# Patient Record
Sex: Female | Born: 1950 | Race: White | Hispanic: No | Smoking: Never smoker
Health system: Southern US, Community
[De-identification: ages and names within clinical notes are randomized; demographics above are authoritative.]

## PROBLEM LIST (undated history)

## (undated) ENCOUNTER — Emergency Department (HOSPITAL_COMMUNITY): Discharge status: Against Medical Advice | Payer: Self-pay

## (undated) DIAGNOSIS — M1990 Unspecified osteoarthritis, unspecified site: Secondary | ICD-10-CM

## (undated) DIAGNOSIS — R5383 Other fatigue: Secondary | ICD-10-CM

## (undated) DIAGNOSIS — I10 Essential (primary) hypertension: Secondary | ICD-10-CM

## (undated) DIAGNOSIS — M549 Dorsalgia, unspecified: Secondary | ICD-10-CM

## (undated) DIAGNOSIS — K219 Gastro-esophageal reflux disease without esophagitis: Secondary | ICD-10-CM

## (undated) DIAGNOSIS — F329 Major depressive disorder, single episode, unspecified: Secondary | ICD-10-CM

## (undated) DIAGNOSIS — D649 Anemia, unspecified: Secondary | ICD-10-CM

## (undated) DIAGNOSIS — F32A Depression, unspecified: Secondary | ICD-10-CM

## (undated) HISTORY — DX: Unspecified osteoarthritis, unspecified site: M19.90

## (undated) HISTORY — DX: Dorsalgia, unspecified: M54.9

## (undated) HISTORY — PX: ABDOMINAL HYSTERECTOMY: SHX81

## (undated) HISTORY — DX: Major depressive disorder, single episode, unspecified: F32.9

## (undated) HISTORY — PX: KNEE SURGERY: SHX244

## (undated) HISTORY — DX: Essential (primary) hypertension: I10

## (undated) HISTORY — PX: TENDON REPAIR: SHX5111

## (undated) HISTORY — DX: Other fatigue: R53.83

## (undated) HISTORY — DX: Depression, unspecified: F32.A

---

## 2005-01-17 ENCOUNTER — Encounter: Admission: RE | Admit: 2005-01-17 | Discharge: 2005-01-17 | Payer: Self-pay | Admitting: Orthopedic Surgery

## 2005-01-29 ENCOUNTER — Ambulatory Visit (HOSPITAL_COMMUNITY): Admission: RE | Admit: 2005-01-29 | Discharge: 2005-01-29 | Payer: Self-pay | Admitting: Orthopedic Surgery

## 2005-01-29 ENCOUNTER — Ambulatory Visit (HOSPITAL_BASED_OUTPATIENT_CLINIC_OR_DEPARTMENT_OTHER): Admission: RE | Admit: 2005-01-29 | Discharge: 2005-01-29 | Payer: Self-pay | Admitting: Orthopedic Surgery

## 2007-04-09 ENCOUNTER — Ambulatory Visit (HOSPITAL_BASED_OUTPATIENT_CLINIC_OR_DEPARTMENT_OTHER): Admission: RE | Admit: 2007-04-09 | Discharge: 2007-04-09 | Payer: Self-pay | Admitting: Orthopedic Surgery

## 2010-01-16 ENCOUNTER — Ambulatory Visit (HOSPITAL_COMMUNITY): Admission: RE | Admit: 2010-01-16 | Discharge: 2010-01-16 | Payer: Self-pay | Admitting: General Surgery

## 2010-01-17 ENCOUNTER — Ambulatory Visit (HOSPITAL_COMMUNITY): Admission: RE | Admit: 2010-01-17 | Discharge: 2010-01-17 | Payer: Self-pay | Admitting: General Surgery

## 2010-01-23 ENCOUNTER — Encounter
Admission: RE | Admit: 2010-01-23 | Discharge: 2010-04-03 | Payer: Self-pay | Source: Home / Self Care | Attending: General Surgery | Admitting: General Surgery

## 2010-02-10 ENCOUNTER — Encounter: Payer: Self-pay | Admitting: Internal Medicine

## 2010-02-28 ENCOUNTER — Ambulatory Visit: Payer: Self-pay | Admitting: Internal Medicine

## 2010-02-28 ENCOUNTER — Ambulatory Visit (HOSPITAL_COMMUNITY): Admission: RE | Admit: 2010-02-28 | Discharge: 2010-02-28 | Payer: Self-pay | Admitting: Internal Medicine

## 2010-04-10 ENCOUNTER — Inpatient Hospital Stay (HOSPITAL_COMMUNITY)
Admission: RE | Admit: 2010-04-10 | Discharge: 2010-04-12 | Payer: Self-pay | Source: Home / Self Care | Attending: General Surgery | Admitting: General Surgery

## 2010-04-10 HISTORY — PX: ROUX-EN-Y GASTRIC BYPASS: SHX1104

## 2010-04-11 ENCOUNTER — Encounter (INDEPENDENT_AMBULATORY_CARE_PROVIDER_SITE_OTHER): Payer: Self-pay | Admitting: General Surgery

## 2010-04-18 ENCOUNTER — Encounter
Admission: RE | Admit: 2010-04-18 | Discharge: 2010-05-30 | Payer: Self-pay | Source: Home / Self Care | Attending: General Surgery | Admitting: General Surgery

## 2010-05-30 NOTE — Letter (Signed)
Summary: TRIAGE ORDER  TRIAGE ORDER   Imported By: Ave Filter 02/10/2010 10:52:46  _____________________________________________________________________  External Attachment:    Type:   Image     Comment:   External Document

## 2010-05-31 ENCOUNTER — Encounter: Admit: 2010-05-31 | Payer: Self-pay | Admitting: General Surgery

## 2010-05-31 ENCOUNTER — Ambulatory Visit: Payer: BC Managed Care – PPO | Attending: General Surgery | Admitting: *Deleted

## 2010-05-31 DIAGNOSIS — Z9884 Bariatric surgery status: Secondary | ICD-10-CM | POA: Insufficient documentation

## 2010-05-31 DIAGNOSIS — Z713 Dietary counseling and surveillance: Secondary | ICD-10-CM | POA: Insufficient documentation

## 2010-05-31 DIAGNOSIS — Z09 Encounter for follow-up examination after completed treatment for conditions other than malignant neoplasm: Secondary | ICD-10-CM | POA: Insufficient documentation

## 2010-07-03 ENCOUNTER — Ambulatory Visit: Payer: BC Managed Care – PPO | Attending: General Surgery | Admitting: *Deleted

## 2010-07-03 DIAGNOSIS — Z713 Dietary counseling and surveillance: Secondary | ICD-10-CM | POA: Insufficient documentation

## 2010-07-03 DIAGNOSIS — Z9884 Bariatric surgery status: Secondary | ICD-10-CM | POA: Insufficient documentation

## 2010-07-03 DIAGNOSIS — Z09 Encounter for follow-up examination after completed treatment for conditions other than malignant neoplasm: Secondary | ICD-10-CM | POA: Insufficient documentation

## 2010-07-11 LAB — DIFFERENTIAL
Basophils Absolute: 0 10*3/uL (ref 0.0–0.1)
Basophils Absolute: 0 10*3/uL (ref 0.0–0.1)
Basophils Relative: 0 % (ref 0–1)
Basophils Relative: 0 % (ref 0–1)
Basophils Relative: 1 % (ref 0–1)
Eosinophils Absolute: 0.1 10*3/uL (ref 0.0–0.7)
Eosinophils Absolute: 0.1 10*3/uL (ref 0.0–0.7)
Eosinophils Absolute: 0.2 10*3/uL (ref 0.0–0.7)
Eosinophils Relative: 1 % (ref 0–5)
Eosinophils Relative: 2 % (ref 0–5)
Lymphocytes Relative: 29 % (ref 12–46)
Lymphs Abs: 2.1 10*3/uL (ref 0.7–4.0)
Monocytes Absolute: 0.6 10*3/uL (ref 0.1–1.0)
Monocytes Absolute: 0.6 10*3/uL (ref 0.1–1.0)
Monocytes Relative: 8 % (ref 3–12)
Monocytes Relative: 9 % (ref 3–12)
Neutro Abs: 4.2 10*3/uL (ref 1.7–7.7)
Neutro Abs: 4.4 10*3/uL (ref 1.7–7.7)
Neutrophils Relative %: 58 % (ref 43–77)
Neutrophils Relative %: 60 % (ref 43–77)
Neutrophils Relative %: 67 % (ref 43–77)

## 2010-07-11 LAB — CBC
HCT: 38.8 % (ref 36.0–46.0)
Hemoglobin: 12 g/dL (ref 12.0–15.0)
Hemoglobin: 14.2 g/dL (ref 12.0–15.0)
MCH: 27.7 pg (ref 26.0–34.0)
MCH: 28.3 pg (ref 26.0–34.0)
MCH: 28.4 pg (ref 26.0–34.0)
MCHC: 30.9 g/dL (ref 30.0–36.0)
MCHC: 32.2 g/dL (ref 30.0–36.0)
MCV: 88.2 fL (ref 78.0–100.0)
Platelets: 164 10*3/uL (ref 150–400)
RBC: 5.02 MIL/uL (ref 3.87–5.11)
RDW: 14.8 % (ref 11.5–15.5)
WBC: 7.1 10*3/uL (ref 4.0–10.5)

## 2010-07-11 LAB — SURGICAL PCR SCREEN: Staphylococcus aureus: NEGATIVE

## 2010-07-11 LAB — COMPREHENSIVE METABOLIC PANEL
ALT: 56 U/L — ABNORMAL HIGH (ref 0–35)
AST: 66 U/L — ABNORMAL HIGH (ref 0–37)
Alkaline Phosphatase: 107 U/L (ref 39–117)
CO2: 29 mEq/L (ref 19–32)
Chloride: 105 mEq/L (ref 96–112)
Creatinine, Ser: 0.76 mg/dL (ref 0.4–1.2)
GFR calc Af Amer: >60 mL/min (ref >60)
GFR calc non Af Amer: >60 mL/min (ref >60)
Potassium: 3.7 mEq/L (ref 3.5–5.1)
Total Bilirubin: 0.6 mg/dL (ref 0.3–1.2)

## 2010-07-11 LAB — HEMOGLOBIN AND HEMATOCRIT, BLOOD
HCT: 38.7 % (ref 36.0–46.0)
Hemoglobin: 12 g/dL (ref 12.0–15.0)
Hemoglobin: 12.8 g/dL (ref 12.0–15.0)

## 2010-09-12 NOTE — Op Note (Signed)
April Noble, April Noble              ACCOUNT NO.:  [id number]   MEDICAL RECORD NO.:  [id number]          PATIENT TYPE:  AMB   LOCATION:  DSC                          FACILITY:  [facility]   PHYSICIAN:  Mila Homer. Sherlean Foot, M.D. DATE OF BIRTH:  01-25-1951   DATE OF PROCEDURE:  04/09/2007  DATE OF DISCHARGE:                               OPERATIVE REPORT   SURGEON:  Mila Homer. Sherlean Foot, M.D.   ASSISTANT:  None.   ANESTHESIA:  General LMA.   PREOPERATIVE DIAGNOSIS:  Left knee osteoarthritis, medial meniscus tear  and Achilles tendonitis, bursitis.   POSTOPERATIVE DIAGNOSIS:  Left knee osteoarthritis, medial meniscus tear  and Achilles tendonitis, bursitis.   PROCEDURE:  Left knee arthroscopy with a partial medial meniscectomy,  chondroplasty in the medial patellofemoral compartments and a  percutaneous injection into the left heel.   INDICATIONS FOR PROCEDURE:  The patient is a 60 year old with mechanical  symptoms.  MRI evidence of meniscus tearing and radiographic evidence of  arthritis and clinical and radiographic evidence of the Achilles  tendonitis and bursitis.   DESCRIPTION OF PROCEDURE:  The patient was laid supine, administered  anesthesia with general LMA anesthesia.  Left leg was prepped and draped  in usual sterile fashion.  Inferolateral inferomedial portals were  created with #11 blade blunt trocar and cannula.  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  Chondroplasty was performed both  surfaces with the [employer] shaver.  I then went into the medial  compartment.  There was grade 3 and 4 chondromalacia here as well.  This  was debrided, chondroplasty performed with a [employer].  There  was lots of tearing, degenerative tearing to the posterior horn medial  meniscus.  An aggressive posterior horn partial medial meniscectomy was  performed straight basket forceps and a Publishing rights manager.  ACL and PCL  appeared normal.  Lateral compartment showed grade 1 to chondromalacia  only no cartilage tears.  I then irrigated closed with 4-0 nylon  sutures, infiltrated with 10 mL of Marcaine morphine mixture in both  portals, dressed with Xeroform dressing, sponges, sterile Webril and Ace  wrap.  After I took down the drapes, I prepped out the medial heel and  placed 8 mL of a 40 mg Depo-Medrol, 6 mL 0.5% Marcaine mixture and  injected that.  The patient was awakened in recovery room in stable  condition.           ______________________________  Mila Homer Sherlean Foot, M.D.     SDL/MEDQ  D:  04/09/2007  T:  04/09/2007  Job:  [id number]

## 2010-09-27 ENCOUNTER — Encounter: Payer: BC Managed Care – PPO | Attending: General Surgery | Admitting: *Deleted

## 2010-09-27 DIAGNOSIS — Z713 Dietary counseling and surveillance: Secondary | ICD-10-CM | POA: Insufficient documentation

## 2010-09-27 DIAGNOSIS — Z09 Encounter for follow-up examination after completed treatment for conditions other than malignant neoplasm: Secondary | ICD-10-CM | POA: Insufficient documentation

## 2010-09-27 DIAGNOSIS — Z9884 Bariatric surgery status: Secondary | ICD-10-CM | POA: Insufficient documentation

## 2010-12-07 ENCOUNTER — Encounter (INDEPENDENT_AMBULATORY_CARE_PROVIDER_SITE_OTHER): Payer: Self-pay | Admitting: General Surgery

## 2010-12-08 ENCOUNTER — Ambulatory Visit (INDEPENDENT_AMBULATORY_CARE_PROVIDER_SITE_OTHER): Payer: BC Managed Care – PPO | Admitting: General Surgery

## 2010-12-08 ENCOUNTER — Other Ambulatory Visit (INDEPENDENT_AMBULATORY_CARE_PROVIDER_SITE_OTHER): Payer: Self-pay | Admitting: General Surgery

## 2010-12-08 DIAGNOSIS — Z9884 Bariatric surgery status: Secondary | ICD-10-CM

## 2010-12-08 NOTE — Progress Notes (Signed)
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 She is getting along very well. She has some occasional mild heartburn but does not require medications. She gets some bloating and pressure occasionally after meals but no severe pain. She is overall very pleased with her success.  Past medical history is significant for hypertension now off all medications, joint pain greatly improved, sleep apnea now off of CPAP.  Exam: General: Appears well Skin: No rash or infection Lungs: Clear without wheezing or increased work of breathing Cardiovascular: 2/6 systolic murmur. Regular rate and rhythm. Abdomen: Soft and nontender. Incisions well healed without hernias. No mass or organomegaly.  Laboratory: Will obtain routine lab work today  Assessment and plan: Doing very well following Roux-en-Y gastric bypass with excellent weight loss. Total weight loss is 68 pounds and her BMI is down to 28.8. She has excellent resolution of comorbidities. Return at her one-year anniversary.

## 2010-12-09 LAB — CBC WITH DIFFERENTIAL/PLATELET
Basophils Absolute: 0 10*3/uL (ref 0.0–0.1)
Eosinophils Relative: 3 % (ref 0–5)
Lymphocytes Relative: 34 % (ref 12–46)
Lymphs Abs: 2.2 10*3/uL (ref 0.7–4.0)
Neutro Abs: 3.4 10*3/uL (ref 1.7–7.7)
Neutrophils Relative %: 54 % (ref 43–77)
Platelets: 197 10*3/uL (ref 150–400)
RBC: 4.99 MIL/uL (ref 3.87–5.11)
RDW: 14.4 % (ref 11.5–15.5)
WBC: 6.4 10*3/uL (ref 4.0–10.5)

## 2010-12-09 LAB — LIPID PANEL
HDL: 58 mg/dL (ref >39)
LDL Cholesterol: 102 mg/dL — ABNORMAL HIGH (ref 0–99)
Total CHOL/HDL Ratio: 3.1 Ratio
VLDL: 19 mg/dL (ref 0–40)

## 2010-12-09 LAB — IRON AND TIBC
TIBC: 390 ug/dL (ref 250–470)
UIBC: 330 ug/dL

## 2010-12-09 LAB — COMPREHENSIVE METABOLIC PANEL
ALT: 14 U/L (ref 0–35)
AST: 18 U/L (ref 0–37)
Alkaline Phosphatase: 105 U/L (ref 39–117)
BUN: 12 mg/dL (ref 6–23)
Calcium: 9.2 mg/dL (ref 8.4–10.5)
Chloride: 105 mEq/L (ref 96–112)
Creat: 0.69 mg/dL (ref 0.50–1.10)

## 2010-12-09 LAB — FOLATE: Folate: 11.2 ng/mL

## 2010-12-12 ENCOUNTER — Telehealth (INDEPENDENT_AMBULATORY_CARE_PROVIDER_SITE_OTHER): Payer: Self-pay

## 2010-12-12 NOTE — Telephone Encounter (Signed)
Message copied by Maryan Puls on Tue Dec 12, 2010  9:32 AM ------      Message from: Glenna Fellows T      Created: Sun Dec 10, 2010  9:59 AM       Please call pt-lab OK but iron saturation a little low. Needs to take one additional iron supplement daily

## 2010-12-12 NOTE — Telephone Encounter (Signed)
Left message for pt to call our office re: iron level.

## 2010-12-14 LAB — VITAMIN B1: Vitamin B1 (Thiamine): 15 nmol/L (ref 9–44)

## 2010-12-28 ENCOUNTER — Telehealth (INDEPENDENT_AMBULATORY_CARE_PROVIDER_SITE_OTHER): Payer: Self-pay | Admitting: General Surgery

## 2011-02-05 LAB — BASIC METABOLIC PANEL
CO2: 30
Calcium: 9.2
GFR calc Af Amer: >60
GFR calc non Af Amer: >60
Potassium: 3.7
Sodium: 138

## 2011-05-11 ENCOUNTER — Ambulatory Visit (INDEPENDENT_AMBULATORY_CARE_PROVIDER_SITE_OTHER): Payer: BC Managed Care – PPO | Admitting: General Surgery

## 2011-05-11 ENCOUNTER — Encounter (INDEPENDENT_AMBULATORY_CARE_PROVIDER_SITE_OTHER): Payer: Self-pay | Admitting: General Surgery

## 2011-05-11 VITALS — BP 118/78 | HR 56 | Temp 98.0°F | Resp 12 | Ht 68.0 in | Wt 173.6 lb

## 2011-05-11 DIAGNOSIS — R198 Other specified symptoms and signs involving the digestive system and abdomen: Secondary | ICD-10-CM

## 2011-05-11 DIAGNOSIS — R109 Unspecified abdominal pain: Secondary | ICD-10-CM

## 2011-05-11 DIAGNOSIS — K912 Postsurgical malabsorption, not elsewhere classified: Secondary | ICD-10-CM

## 2011-05-11 NOTE — Progress Notes (Signed)
Chief complaint: Followup Roux-en-Y gastric bypass   History: April Noble returns just over one year following laparoscopic Roux-en-Y gastric bypass.  Overall she's been getting along very well. She has had excellent weight loss and now has a total weight loss of 78 pounds from preop of 252 and BMI of 39.62 current 174 and BMI of 26.2. Her energy is excellent. She has resolved hypertension on no meds and resolved sleep apnea. Her joint pain is much improved.  She does however have postprandial abdominal discomfort and what she describes as reflux. She describes pressure discomfort up under her chest and in her upper abdomen after eating with also some sensation of burning. No nausea or vomiting or regurgitation. She tried [place] but this didn't really help. She did not have significant reflux prior to surgery.  Exam: BP 118/78  Pulse 56  Temp(Src) 98 F (36.7 C) (Temporal)  Resp 12  Ht 5\' 8"  (1.727 m)  Wt 173 lb 9.6 oz (78.744 kg)  BMI 26.40 kg/m2  Gen.: Healthy-appearing female in no distress Skin: Warm dry no rash or infection Lungs: Clear without wheeze or increased work of breathing Abdomen: Well-healed incision. No hernias. Soft and nontender. Extremities no edema.  Assessment and plan: Overall doing very well following laparoscopic gastric bypass with excellent weight loss and resolution of comorbidities. She does however have some upper abdominal and lower chest discomfort after eating. I think this needs to be evaluated. We are going to get a gallbladder ultrasound and a barium upper GI series to rule out gallstones or stenosis. I will call her back with these results.

## 2011-05-17 ENCOUNTER — Telehealth (INDEPENDENT_AMBULATORY_CARE_PROVIDER_SITE_OTHER): Payer: Self-pay | Admitting: General Surgery

## 2011-05-17 ENCOUNTER — Telehealth (INDEPENDENT_AMBULATORY_CARE_PROVIDER_SITE_OTHER): Payer: Self-pay

## 2011-05-17 ENCOUNTER — Other Ambulatory Visit (INDEPENDENT_AMBULATORY_CARE_PROVIDER_SITE_OTHER): Payer: Self-pay | Admitting: General Surgery

## 2011-05-17 ENCOUNTER — Ambulatory Visit
Admission: RE | Admit: 2011-05-17 | Discharge: 2011-05-17 | Disposition: A | Discharge status: Home / Self Care | Payer: BC Managed Care – PPO | Source: Ambulatory Visit | Attending: General Surgery | Admitting: General Surgery

## 2011-05-17 DIAGNOSIS — R198 Other specified symptoms and signs involving the digestive system and abdomen: Secondary | ICD-10-CM

## 2011-05-17 NOTE — Telephone Encounter (Signed)
Called patient with appointment date & time for 06/07/11 w/Dr. Johna Sheriff to discuss U/S findings (gallstones) and to schedule surgery.

## 2011-05-17 NOTE — Telephone Encounter (Signed)
Called pt with results.

## 2011-05-18 LAB — IRON AND TIBC
%SAT: 26 % (ref 20–55)
TIBC: 399 ug/dL (ref 250–470)

## 2011-05-18 LAB — CBC WITH DIFFERENTIAL/PLATELET
Eosinophils Relative: 3 % (ref 0–5)
HCT: 42.1 % (ref 36.0–46.0)
Hemoglobin: 13.5 g/dL (ref 12.0–15.0)
Lymphocytes Relative: 37 % (ref 12–46)
MCHC: 32.1 g/dL (ref 30.0–36.0)
MCV: 87.5 fL (ref 78.0–100.0)
Monocytes Absolute: 0.3 10*3/uL (ref 0.1–1.0)
Monocytes Relative: 6 % (ref 3–12)
Neutro Abs: 2.7 10*3/uL (ref 1.7–7.7)
RDW: 14.6 % (ref 11.5–15.5)
WBC: 5 10*3/uL (ref 4.0–10.5)

## 2011-05-18 LAB — MAGNESIUM: Magnesium: 1.9 mg/dL (ref 1.5–2.5)

## 2011-05-18 LAB — COMPREHENSIVE METABOLIC PANEL
Albumin: 4.3 g/dL (ref 3.5–5.2)
BUN: 13 mg/dL (ref 6–23)
CO2: 30 mEq/L (ref 19–32)
Calcium: 9.1 mg/dL (ref 8.4–10.5)
Chloride: 104 mEq/L (ref 96–112)
Creat: 0.6 mg/dL (ref 0.50–1.10)
Glucose, Bld: 86 mg/dL (ref 70–99)
Potassium: 3.7 mEq/L (ref 3.5–5.3)

## 2011-05-18 LAB — LIPID PANEL
Cholesterol: 190 mg/dL (ref 0–200)
HDL: 63 mg/dL (ref >39)
Total CHOL/HDL Ratio: 3 Ratio
Triglycerides: 98 mg/dL (ref <150)

## 2011-05-18 LAB — VITAMIN B12: Vitamin B-12: 1077 pg/mL — ABNORMAL HIGH (ref 211–911)

## 2011-05-18 LAB — FOLATE: Folate: >20 ng/mL

## 2011-05-22 ENCOUNTER — Other Ambulatory Visit (INDEPENDENT_AMBULATORY_CARE_PROVIDER_SITE_OTHER): Payer: Self-pay | Admitting: General Surgery

## 2011-05-23 ENCOUNTER — Encounter (HOSPITAL_COMMUNITY): Payer: Self-pay

## 2011-05-24 ENCOUNTER — Other Ambulatory Visit: Payer: Self-pay

## 2011-05-24 ENCOUNTER — Encounter (HOSPITAL_COMMUNITY)
Admission: RE | Admit: 2011-05-24 | Discharge: 2011-05-24 | Disposition: A | Discharge status: Home / Self Care | Payer: BC Managed Care – PPO | Source: Ambulatory Visit | Attending: General Surgery | Admitting: General Surgery

## 2011-05-24 ENCOUNTER — Encounter (HOSPITAL_COMMUNITY): Payer: Self-pay

## 2011-05-24 HISTORY — DX: Gastro-esophageal reflux disease without esophagitis: K21.9

## 2011-05-24 HISTORY — DX: Anemia, unspecified: D64.9

## 2011-05-24 LAB — SURGICAL PCR SCREEN: MRSA, PCR: NEGATIVE

## 2011-05-24 NOTE — Patient Instructions (Addendum)
20 April Noble  05/24/2011   Your procedure is scheduled on:  05-30-2011  Report to [facility] at 0615 AM.  Call this number if you have problems the morning of surgery: [phone]   Remember:   Do not eat food or drink liquids:After Midnight.    Take these medicines the morning of surgery with A SIP OF WATER: no meds to take   Do not wear jewelry, make-up.  Do not wear lotions, powders, or perfumes. Do not wear deodorant.  Do not shave 48 hours prior to surgery.  Do not bring valuables to the hospital.  Contacts, dentures or bridgework may not be worn into surgery.  Leave suitcase in the car. After surgery it may be brought to your room.  For patients admitted to the hospital, checkout time is 11:00 AM the day of discharge.   Patients discharged the day of surgery will not be allowed to drive home.  Name and phone number of your driver: sam spouse [phone]  Special Instructions: CHG Shower Use Special Wash: 1/2 bottle night before surgery and 1/2 bottle morning of surgery. neck down avoid private area   Please read over the following fact sheets that you were given: MRSA Information Cain Sieve rn [facility] pre op nurse phone number [phone] call if needed.

## 2011-05-24 NOTE — Pre-Procedure Instructions (Addendum)
Spoke with dr fortune, pt history of hypertension, no blood pressure meds for 1 year. Order received for ekg, chest xray not needed. Cbc with dif, cmet 1-17-201 in epic

## 2011-05-30 ENCOUNTER — Other Ambulatory Visit (INDEPENDENT_AMBULATORY_CARE_PROVIDER_SITE_OTHER): Payer: Self-pay | Admitting: General Surgery

## 2011-05-30 ENCOUNTER — Encounter (HOSPITAL_COMMUNITY): Payer: Self-pay | Admitting: Anesthesiology

## 2011-05-30 ENCOUNTER — Ambulatory Visit (HOSPITAL_COMMUNITY): Payer: BC Managed Care – PPO

## 2011-05-30 ENCOUNTER — Encounter (HOSPITAL_COMMUNITY)
Admission: RE | Disposition: A | Discharge status: Home / Self Care | Payer: Self-pay | Source: Ambulatory Visit | Attending: General Surgery

## 2011-05-30 ENCOUNTER — Ambulatory Visit (HOSPITAL_COMMUNITY)
Admission: RE | Admit: 2011-05-30 | Discharge: 2011-05-30 | Disposition: A | Discharge status: Home / Self Care | Payer: BC Managed Care – PPO | Source: Ambulatory Visit | Attending: General Surgery | Admitting: General Surgery

## 2011-05-30 ENCOUNTER — Encounter (HOSPITAL_COMMUNITY): Payer: Self-pay | Admitting: *Deleted

## 2011-05-30 ENCOUNTER — Ambulatory Visit (HOSPITAL_COMMUNITY): Payer: BC Managed Care – PPO | Admitting: Anesthesiology

## 2011-05-30 ENCOUNTER — Telehealth (INDEPENDENT_AMBULATORY_CARE_PROVIDER_SITE_OTHER): Payer: Self-pay

## 2011-05-30 DIAGNOSIS — K8010 Calculus of gallbladder with chronic cholecystitis without obstruction: Secondary | ICD-10-CM | POA: Insufficient documentation

## 2011-05-30 DIAGNOSIS — K824 Cholesterolosis of gallbladder: Secondary | ICD-10-CM

## 2011-05-30 DIAGNOSIS — Z01810 Encounter for preprocedural cardiovascular examination: Secondary | ICD-10-CM | POA: Insufficient documentation

## 2011-05-30 DIAGNOSIS — Z01812 Encounter for preprocedural laboratory examination: Secondary | ICD-10-CM | POA: Insufficient documentation

## 2011-05-30 DIAGNOSIS — Z9884 Bariatric surgery status: Secondary | ICD-10-CM | POA: Insufficient documentation

## 2011-05-30 HISTORY — PX: CHOLECYSTECTOMY: SHX55

## 2011-05-30 SURGERY — LAPAROSCOPIC CHOLECYSTECTOMY WITH INTRAOPERATIVE CHOLANGIOGRAM
Anesthesia: General | Site: Abdomen | Wound class: Clean Contaminated

## 2011-05-30 MED ORDER — HYDROMORPHONE HCL PF 1 MG/ML IJ SOLN
INTRAMUSCULAR | Status: AC
  Filled 2011-05-30: qty 1

## 2011-05-30 MED ORDER — PROMETHAZINE HCL 25 MG/ML IJ SOLN: 6.2500 mg | INTRAMUSCULAR | Status: DC | PRN

## 2011-05-30 MED ORDER — NEOSTIGMINE METHYLSULFATE 1 MG/ML IJ SOLN
INTRAMUSCULAR | Status: DC | PRN
  Administered 2011-05-30: 4 mg via INTRAVENOUS

## 2011-05-30 MED ORDER — GLYCOPYRROLATE 0.2 MG/ML IJ SOLN
INTRAMUSCULAR | Status: DC | PRN
  Administered 2011-05-30: 1 mg via INTRAVENOUS

## 2011-05-30 MED ORDER — PROPOFOL 10 MG/ML IV BOLUS
INTRAVENOUS | Status: DC | PRN
  Administered 2011-05-30: 150 mg via INTRAVENOUS

## 2011-05-30 MED ORDER — CEFAZOLIN SODIUM 1-5 GM-% IV SOLN
INTRAVENOUS | Status: AC
  Filled 2011-05-30: qty 50

## 2011-05-30 MED ORDER — BUPIVACAINE-EPINEPHRINE 0.25% -1:200000 IJ SOLN
INTRAMUSCULAR | Status: DC | PRN
  Administered 2011-05-30: 30 mL

## 2011-05-30 MED ORDER — LACTATED RINGERS IV SOLN
INTRAVENOUS | Status: DC | PRN
  Administered 2011-05-30: 08:00:00 via INTRAVENOUS

## 2011-05-30 MED ORDER — ACETAMINOPHEN 10 MG/ML IV SOLN
INTRAVENOUS | Status: DC | PRN
  Administered 2011-05-30: 1000 mg via INTRAVENOUS

## 2011-05-30 MED ORDER — HYDROCODONE-ACETAMINOPHEN 5-325 MG PO TABS: 1.0000 | ORAL_TABLET | ORAL | Status: AC | PRN

## 2011-05-30 MED ORDER — BUPIVACAINE-EPINEPHRINE PF 0.25-1:200000 % IJ SOLN
INTRAMUSCULAR | Status: AC
  Filled 2011-05-30: qty 30

## 2011-05-30 MED ORDER — ROCURONIUM BROMIDE 100 MG/10ML IV SOLN
INTRAVENOUS | Status: DC | PRN
  Administered 2011-05-30: 40 mg via INTRAVENOUS
  Administered 2011-05-30: 10 mg via INTRAVENOUS

## 2011-05-30 MED ORDER — ONDANSETRON HCL 4 MG/2ML IJ SOLN
INTRAMUSCULAR | Status: DC | PRN
  Administered 2011-05-30: 4 mg via INTRAVENOUS

## 2011-05-30 MED ORDER — CEFAZOLIN SODIUM 1-5 GM-% IV SOLN
1.0000 g | INTRAVENOUS | Status: AC
  Administered 2011-05-30: 1 g via INTRAVENOUS

## 2011-05-30 MED ORDER — LACTATED RINGERS IR SOLN
Status: DC | PRN
  Administered 2011-05-30: 1000 mL

## 2011-05-30 MED ORDER — MIDAZOLAM HCL 5 MG/5ML IJ SOLN
INTRAMUSCULAR | Status: DC | PRN
  Administered 2011-05-30: 2 mg via INTRAVENOUS

## 2011-05-30 MED ORDER — LIDOCAINE HCL (CARDIAC) 20 MG/ML IV SOLN
INTRAVENOUS | Status: DC | PRN
  Administered 2011-05-30: 50 mg via INTRAVENOUS

## 2011-05-30 MED ORDER — LACTATED RINGERS IV SOLN: INTRAVENOUS | Status: DC

## 2011-05-30 MED ORDER — HYDROMORPHONE HCL PF 1 MG/ML IJ SOLN
0.2500 mg | INTRAMUSCULAR | Status: DC | PRN
  Administered 2011-05-30: 0.25 mg via INTRAVENOUS

## 2011-05-30 MED ORDER — ACETAMINOPHEN 10 MG/ML IV SOLN
INTRAVENOUS | Status: AC
  Filled 2011-05-30: qty 100

## 2011-05-30 MED ORDER — IOHEXOL 300 MG/ML  SOLN
INTRAMUSCULAR | Status: DC | PRN
  Administered 2011-05-30: 3 mL

## 2011-05-30 MED ORDER — IOHEXOL 300 MG/ML  SOLN
INTRAMUSCULAR | Status: AC
  Filled 2011-05-30: qty 1

## 2011-05-30 MED ORDER — 0.9 % SODIUM CHLORIDE (POUR BTL) OPTIME
TOPICAL | Status: DC | PRN
  Administered 2011-05-30: 1000 mL

## 2011-05-30 MED ORDER — FENTANYL CITRATE 0.05 MG/ML IJ SOLN
INTRAMUSCULAR | Status: DC | PRN
  Administered 2011-05-30 (×2): 50 ug via INTRAVENOUS
  Administered 2011-05-30: 100 ug via INTRAVENOUS

## 2011-05-30 SURGICAL SUPPLY — 44 items
ADH SKN CLS APL DERMABOND .7 (GAUZE/BANDAGES/DRESSINGS) ×1
APL SKNCLS STERI-STRIP NONHPOA (GAUZE/BANDAGES/DRESSINGS)
APPLIER CLIP ROT 10 11.4 M/L (STAPLE) ×2
APR CLP MED LRG 11.4X10 (STAPLE) ×1
BAG SPEC RTRVL LRG 6X4 10 (ENDOMECHANICALS) ×1
BENZOIN TINCTURE PRP APPL 2/3 (GAUZE/BANDAGES/DRESSINGS) IMPLANT
CANISTER SUCTION 2500CC (MISCELLANEOUS) ×2 IMPLANT
CATH REDDICK CHOLANGI 4FR 50CM (CATHETERS) IMPLANT
CLIP APPLIE ROT 10 11.4 M/L (STAPLE) ×1 IMPLANT
CLOTH BEACON ORANGE TIMEOUT ST (SAFETY) ×2 IMPLANT
COVER MAYO STAND STRL (DRAPES) ×2 IMPLANT
COVER SURGICAL LIGHT HANDLE (MISCELLANEOUS) ×1 IMPLANT
DECANTER SPIKE VIAL GLASS SM (MISCELLANEOUS) ×2 IMPLANT
DERMABOND ADVANCED (GAUZE/BANDAGES/DRESSINGS) ×1
DERMABOND ADVANCED .7 DNX12 (GAUZE/BANDAGES/DRESSINGS) IMPLANT
DRAPE C-ARM 42X72 X-RAY (DRAPES) ×2 IMPLANT
DRAPE LAPAROSCOPIC ABDOMINAL (DRAPES) ×2 IMPLANT
ELECT REM PT RETURN 9FT ADLT (ELECTROSURGICAL) ×2
ELECTRODE REM PT RTRN 9FT ADLT (ELECTROSURGICAL) ×1 IMPLANT
GLOVE BIOGEL PI IND STRL 7.0 (GLOVE) ×1 IMPLANT
GLOVE BIOGEL PI INDICATOR 7.0 (GLOVE) ×1
GLOVE SS BIOGEL STRL SZ 7.5 (GLOVE) ×1 IMPLANT
GLOVE SUPERSENSE BIOGEL SZ 7.5 (GLOVE) ×1
GOWN STRL NON-REIN LRG LVL3 (GOWN DISPOSABLE) ×2 IMPLANT
GOWN STRL REIN XL XLG (GOWN DISPOSABLE) ×4 IMPLANT
HEMOSTAT SURGICEL 4X8 (HEMOSTASIS) IMPLANT
IV CATH 14GX2 1/4 (CATHETERS) IMPLANT
IV SET EXT 30 76VOL 4 MALE LL (IV SETS) IMPLANT
KIT BASIN OR (CUSTOM PROCEDURE TRAY) ×2 IMPLANT
NS IRRIG 1000ML POUR BTL (IV SOLUTION) IMPLANT
POUCH SPECIMEN RETRIEVAL 10MM (ENDOMECHANICALS) ×1 IMPLANT
SET CHOLANGIOGRAPH MIX (MISCELLANEOUS) ×2 IMPLANT
SET IRRIG TUBING LAPAROSCOPIC (IRRIGATION / IRRIGATOR) ×2 IMPLANT
SLEEVE Z-THREAD 5X100MM (TROCAR) ×2 IMPLANT
SOLUTION ANTI FOG 6CC (MISCELLANEOUS) ×2 IMPLANT
STOPCOCK K 69 2C6206 (IV SETS) IMPLANT
STRIP CLOSURE SKIN 1/2X4 (GAUZE/BANDAGES/DRESSINGS) IMPLANT
SUT MNCRL AB 4-0 PS2 18 (SUTURE) ×2 IMPLANT
TOWEL OR 17X26 10 PK STRL BLUE (TOWEL DISPOSABLE) ×2 IMPLANT
TRAY LAP CHOLE (CUSTOM PROCEDURE TRAY) ×2 IMPLANT
TROCAR HASSON GELL 12X100 (TROCAR) ×2 IMPLANT
TROCAR Z-THREAD FIOS 11X100 BL (TROCAR) ×2 IMPLANT
TROCAR Z-THREAD FIOS 5X100MM (TROCAR) ×2 IMPLANT
TUBING INSUFFLATION 10FT LAP (TUBING) ×2 IMPLANT

## 2011-05-30 NOTE — Interval H&P Note (Signed)
History and Physical Interval Note:  05/30/2011 7:34 AM  April Noble  has presented today for surgery, with the diagnosis of gallstones  The various methods of treatment have been discussed with the patient and family. After consideration of risks, benefits and other options for treatment, the patient has consented to  Procedure(s): LAPAROSCOPIC CHOLECYSTECTOMY WITH INTRAOPERATIVE CHOLANGIOGRAM as a surgical intervention .  The patients' history has been reviewed, patient examined, no change in status, stable for surgery.  I have reviewed the patients' chart and labs.  Questions were answered to the patient's satisfaction.     [name] T

## 2011-05-30 NOTE — Telephone Encounter (Signed)
Voice message left for f/u appt scheduled for 06/22/11 @ 4:15 w/Dr. Johna Sheriff

## 2011-05-30 NOTE — Transfer of Care (Signed)
Immediate Anesthesia Transfer of Care Note  Patient: April Noble  Procedure(s) Performed:  LAPAROSCOPIC CHOLECYSTECTOMY WITH INTRAOPERATIVE CHOLANGIOGRAM  Patient Location: PACU  Anesthesia Type: General  Level of Consciousness: sedated, patient cooperative and responds to stimulaton  Airway & Oxygen Therapy: Patient Spontanous Breathing and Patient connected to face mask oxgen  Post-op Assessment: Report given to PACU RN and Post -op Vital signs reviewed and stable  Post vital signs: Reviewed and stable  Complications: No apparent anesthesia complications

## 2011-05-30 NOTE — Anesthesia Postprocedure Evaluation (Signed)
  Anesthesia Post-op Note  Patient: April Noble  Procedure(s) Performed:  LAPAROSCOPIC CHOLECYSTECTOMY WITH INTRAOPERATIVE CHOLANGIOGRAM  Patient Location: PACU  Anesthesia Type: General  Level of Consciousness: awake and alert   Airway and Oxygen Therapy: Patient Spontanous Breathing  Post-op Pain: mild  Post-op Assessment: Post-op Vital signs reviewed, Patient's Cardiovascular Status Stable, Respiratory Function Stable, Patent Airway and No signs of Nausea or vomiting  Post-op Vital Signs: stable  Complications: No apparent anesthesia complications

## 2011-05-30 NOTE — Op Note (Signed)
Preoperative diagnosis: Cholelithiasis and cholecystitis  Postoperative diagnosis: Cholelithiasis and cholecystitis  Surgical procedure: Laparoscopic cholecystectomy with intraoperative cholangiogram  Surgeon: Sharlet Salina T. Nicodemus Denk M.D.  Assistant: None  Anesthesia: General Endotracheal  Complications: None  Estimated blood loss: Minimal  Description of procedure: The patient brought to the operating room, placed in the supine position on the operating table, and general endotracheal anesthesia induced. The abdomen was widely sterilely prepped and draped. The patient had received preoperative IV antibiotics and PAS were in place. Patient timeout was performed the correct procedure verified. Standard 4 port technique was used with an open Hassan cannula at the umbilicus and the remainder of the ports placed under direct vision. The gallbladder was visualized. It had cholesterolosis in the wall but was not acutely inflamed.  As the patient is post gastric bypass I initially carefully examined the GI tract. The gastric pouch and Roux limb appeared normal as did the gastric remnant. I traced the Roux limb distally and there was no evidence of Peterson hernia defect. I then traced the small bowel further distally to the jejunojejunostomy which appeared normal and there was no mesenteric defect here either. Attention was then turned to the gallbladder.The fundus was grasped and elevated up over the liver and the infundibulum retracted inferiolaterally. Peritoneum anterior and posterior to close triangle was incised and fibrofatty tissue stripped off the neck of the gallbladder toward the porta hepatis. The distal gallbladder was thoroughly dissected. The cystic artery was identified in close triangle and the cystic duct gallbladder junction dissected 360.  A good critical view was obtained. When the anatomy was clear the cystic duct was clipped at the gallbladder junction and an operative cholangiogram  obtained through the cystic duct. This showed good filling of a normal common bile duct and intrahepatic ducts with free flow into the duodenum and no filling defects. Following this the Cholangiocath was removed and the cystic duct was doubly clipped proximally and divided. The cystic artery was doubly clipped proximally and distally and divided. The gallbladder was dissected free from its bed using hook cautery and removed through the umbilical port site. Complete hemostasis was obtained in the gallbladder bed. The right upper quadrant was thoroughly irrigated and hemostasis assured. Trochars were removed and all CO2 evacuated and the [facility] trocar site fascial defect closed. Skin incisions were closed with subcuticular Monocryl and Dermabond. Sponge needle and instrument counts were correct. The patient was taken to PACU in good condition.  Herschel Fleagle T  05/30/2011

## 2011-05-30 NOTE — Anesthesia Preprocedure Evaluation (Signed)
Anesthesia Evaluation  Patient identified by MRN, date of birth, ID band Patient awake    Reviewed: Allergy & Precautions, H&P , NPO status , Patient's Chart, lab work & pertinent test results  Airway Mallampati: II TM Distance: >3 FB Neck ROM: Full    Dental No notable dental hx.    Pulmonary neg pulmonary ROS,  clear to auscultation  Pulmonary exam normal       Cardiovascular hypertension, Regular Normal    Neuro/Psych PSYCHIATRIC DISORDERS Depression Negative Neurological ROS     GI/Hepatic Neg liver ROS, GERD-  ,  Endo/Other  Negative Endocrine ROS  Renal/GU negative Renal ROS  Genitourinary negative   Musculoskeletal negative musculoskeletal ROS (+)   Abdominal   Peds negative pediatric ROS (+)  Hematology negative hematology ROS (+)   Anesthesia Other Findings   Reproductive/Obstetrics negative OB ROS                           Anesthesia Physical Anesthesia Plan  ASA: II  Anesthesia Plan: General   Post-op Pain Management:    Induction: Intravenous  Airway Management Planned: Oral ETT  Additional Equipment:   Intra-op Plan:   Post-operative Plan: Extubation in OR  Informed Consent: I have reviewed the patients History and Physical, chart, labs and discussed the procedure including the risks, benefits and alternatives for the proposed anesthesia with the patient or authorized representative who has indicated his/her understanding and acceptance.   Dental advisory given  Plan Discussed with: CRNA  Anesthesia Plan Comments:         Anesthesia Quick Evaluation

## 2011-05-30 NOTE — H&P (View-Only) (Signed)
Chief complaint: Followup Roux-en-Y gastric bypass   History: April Noble returns just over one year following laparoscopic Roux-en-Y gastric bypass.  Overall she's been getting along very well. She has had excellent weight loss and now has a total weight loss of 78 pounds from preop of 252 and BMI of 39.62 current 174 and BMI of 26.2. Her energy is excellent. She has resolved hypertension on no meds and resolved sleep apnea. Her joint pain is much improved.  She does however have postprandial abdominal discomfort and what she describes as reflux. She describes pressure discomfort up under her chest and in her upper abdomen after eating with also some sensation of burning. No nausea or vomiting or regurgitation. She tried TUMS but this didn't really help. She did not have significant reflux prior to surgery.  Exam: BP 118/78  Pulse 56  Temp(Src) 98 F (36.7 C) (Temporal)  Resp 12  Ht 5' 8" (1.727 m)  Wt 173 lb 9.6 oz (78.744 kg)  BMI 26.40 kg/m2  Gen.: Healthy-appearing female in no distress Skin: Warm dry no rash or infection Lungs: Clear without wheeze or increased work of breathing Abdomen: Well-healed incision. No hernias. Soft and nontender. Extremities no edema.  Assessment and plan: Overall doing very well following laparoscopic gastric bypass with excellent weight loss and resolution of comorbidities. She does however have some upper abdominal and lower chest discomfort after eating. I think this needs to be evaluated. We are going to get a gallbladder ultrasound and a barium upper GI series to rule out gallstones or stenosis. I will call her back with these results. 

## 2011-05-31 ENCOUNTER — Encounter (HOSPITAL_COMMUNITY): Payer: Self-pay | Admitting: General Surgery

## 2011-06-06 ENCOUNTER — Telehealth (INDEPENDENT_AMBULATORY_CARE_PROVIDER_SITE_OTHER): Payer: Self-pay

## 2011-06-06 NOTE — Telephone Encounter (Signed)
Post op appointment given to patient for 06/22/11 @ 4:15 w/Dr. Johna Sheriff (lap chole)

## 2011-06-07 ENCOUNTER — Ambulatory Visit (INDEPENDENT_AMBULATORY_CARE_PROVIDER_SITE_OTHER): Payer: BC Managed Care – PPO | Admitting: General Surgery

## 2011-06-22 ENCOUNTER — Ambulatory Visit (INDEPENDENT_AMBULATORY_CARE_PROVIDER_SITE_OTHER): Payer: BC Managed Care – PPO | Admitting: General Surgery

## 2011-06-22 ENCOUNTER — Encounter (INDEPENDENT_AMBULATORY_CARE_PROVIDER_SITE_OTHER): Payer: Self-pay | Admitting: General Surgery

## 2011-06-22 VITALS — BP 124/82 | HR 66 | Temp 97.2°F | Resp 18 | Ht 68.0 in | Wt 172.0 lb

## 2011-06-22 DIAGNOSIS — Z09 Encounter for follow-up examination after completed treatment for conditions other than malignant neoplasm: Secondary | ICD-10-CM

## 2011-06-22 NOTE — Progress Notes (Signed)
History: Patient returns for postop check following laparoscopic cholecystectomy. She is just over one year following Roux-en-Y gastric bypass with excellent weight loss from 252 pounds 2 172 pounds with current BMI of 26.2. She reports that she's done well after surgery except for a little irritation and drainage at one of the 5 mm trocar sites. Her preoperative symptoms have been relieved.  Exam: She appears well. Abdomen is soft and nontender. There appears to be in some very slight separation at one of the 5 mm sites but this is now healing well.  Pathology confirmed chronic cholecystitis and cholelithiasis.  Assessment and plan: Doing well following laparoscopic cholecystectomy without complication. She has done great from her bypass. We'll plan to see her for bariatric followup in one year.

## 2012-06-27 ENCOUNTER — Other Ambulatory Visit (INDEPENDENT_AMBULATORY_CARE_PROVIDER_SITE_OTHER): Payer: Self-pay

## 2012-06-27 ENCOUNTER — Other Ambulatory Visit (INDEPENDENT_AMBULATORY_CARE_PROVIDER_SITE_OTHER): Payer: Self-pay | Admitting: General Surgery

## 2012-06-27 ENCOUNTER — Encounter (INDEPENDENT_AMBULATORY_CARE_PROVIDER_SITE_OTHER): Payer: Self-pay | Admitting: General Surgery

## 2012-06-27 ENCOUNTER — Ambulatory Visit (INDEPENDENT_AMBULATORY_CARE_PROVIDER_SITE_OTHER): Payer: BC Managed Care – PPO | Admitting: General Surgery

## 2012-06-27 DIAGNOSIS — Z6828 Body mass index (BMI) 28.0-28.9, adult: Secondary | ICD-10-CM

## 2012-06-27 DIAGNOSIS — I10 Essential (primary) hypertension: Secondary | ICD-10-CM

## 2012-06-27 DIAGNOSIS — Z09 Encounter for follow-up examination after completed treatment for conditions other than malignant neoplasm: Secondary | ICD-10-CM

## 2012-06-27 NOTE — Progress Notes (Signed)
Chief complaint: Followup gastric bypass  History: Patient returns for followup of gastric bypass performed December 2011. She subsequently had a laparoscopic cholecystectomy about a year ago. She continues to do very well. She is noted some persistent burning of her hair. She really has no significant abdominal or GI complaints. She remains active and is maintained her excellent weight loss very well. Her preoperative joint pain is improved. Hypertension resolved off of all medications.  Exam: BP 136/80  Pulse 80  Resp 14  Ht 5\' 8"  (1.727 m)  Wt 172 lb (78.019 kg)  BMI 26.16 kg/m2 Total weight loss 80 pounds, no change in past year General: Appears well Lungs: Clear [place] sounds Lymph nodes no cervical supraclavicular nodes palpable Cardiac: Regular rate and rhythm. No edema Abdomen: Soft nontender. No masses or organomegaly or hernias  Assessment and plan: Continues to do very well post bypass with hypertension resolved joint pain improved and excellent maintenance of weight loss. We will check routine lab and vitamin levels today and call her with results. Return in one year.

## 2012-07-03 LAB — CBC WITH DIFFERENTIAL/PLATELET
Basophils Absolute: 0 10*3/uL (ref 0.0–0.1)
Basophils Relative: 1 % (ref 0–1)
Eosinophils Relative: 4 % (ref 0–5)
HCT: 38.7 % (ref 36.0–46.0)
Hemoglobin: 12.8 g/dL (ref 12.0–15.0)
Lymphocytes Relative: 39 % (ref 12–46)
MCHC: 33.1 g/dL (ref 30.0–36.0)
MCV: 84.1 fL (ref 78.0–100.0)
Monocytes Absolute: 0.5 10*3/uL (ref 0.1–1.0)
Monocytes Relative: 13 % — ABNORMAL HIGH (ref 3–12)
Neutro Abs: 1.7 10*3/uL (ref 1.7–7.7)
RDW: 14.3 % (ref 11.5–15.5)

## 2012-07-04 LAB — VITAMIN B12: Vitamin B-12: 848 pg/mL (ref 211–911)

## 2012-07-07 ENCOUNTER — Telehealth (INDEPENDENT_AMBULATORY_CARE_PROVIDER_SITE_OTHER): Payer: Self-pay

## 2012-07-07 LAB — VITAMIN D 25 HYDROXY (VIT D DEFICIENCY, FRACTURES): Vit D, 25-Hydroxy: 25 ng/mL — ABNORMAL LOW (ref 30–89)

## 2012-07-07 NOTE — Telephone Encounter (Signed)
Called and left message to call our office (labs okay)

## 2012-07-08 ENCOUNTER — Telehealth (INDEPENDENT_AMBULATORY_CARE_PROVIDER_SITE_OTHER): Payer: Self-pay

## 2012-07-08 LAB — COMPREHENSIVE METABOLIC PANEL
AST: 22 U/L (ref 0–37)
Albumin: 4.3 g/dL (ref 3.5–5.2)
BUN: 11 mg/dL (ref 6–23)
Calcium: 8.7 mg/dL (ref 8.4–10.5)
Chloride: 105 mEq/L (ref 96–112)
Creat: 0.78 mg/dL (ref 0.50–1.10)
Glucose, Bld: 80 mg/dL (ref 70–99)
Potassium: 3.9 mEq/L (ref 3.5–5.3)

## 2012-07-08 LAB — LIPID PANEL: Cholesterol: 177 mg/dL (ref 0–200)

## 2012-07-08 LAB — IRON: Iron: 32 ug/dL — ABNORMAL LOW (ref 42–145)

## 2012-07-08 LAB — FOLATE: Folate: >20 ng/mL

## 2012-07-08 LAB — IBC PANEL
%SAT: 8 % — ABNORMAL LOW (ref 20–55)
TIBC: 391 ug/dL (ref 250–470)

## 2012-07-08 NOTE — Telephone Encounter (Signed)
ERROR

## 2012-07-08 NOTE — Progress Notes (Signed)
Patient given lab results. 

## 2012-07-09 LAB — MAGNESIUM: Magnesium: 1.9 mg/dL (ref 1.5–2.5)

## 2012-07-14 ENCOUNTER — Telehealth (INDEPENDENT_AMBULATORY_CARE_PROVIDER_SITE_OTHER): Payer: Self-pay

## 2012-07-14 NOTE — Telephone Encounter (Signed)
Message copied by Maryan Puls on Mon Jul 14, 2012  1:43 PM ------      Message from: Glenna Fellows T      Created: Mon Jul 14, 2012 10:52 AM       Please call patient "Lab OK but iron level slightly low.  Need top take one additional iron supplement daily" ------

## 2012-07-14 NOTE — Telephone Encounter (Signed)
Patient advised Iron slightly low and to take an additional iron supplement daily.

## 2013-02-06 ENCOUNTER — Other Ambulatory Visit: Payer: Self-pay | Admitting: Internal Medicine

## 2013-02-06 DIAGNOSIS — R609 Edema, unspecified: Secondary | ICD-10-CM

## 2013-02-06 DIAGNOSIS — M25512 Pain in left shoulder: Secondary | ICD-10-CM

## 2013-02-14 ENCOUNTER — Ambulatory Visit
Admission: RE | Admit: 2013-02-14 | Discharge: 2013-02-14 | Disposition: A | Discharge status: Home / Self Care | Payer: BC Managed Care – PPO | Source: Ambulatory Visit | Attending: Internal Medicine | Admitting: Internal Medicine

## 2013-02-14 DIAGNOSIS — M25512 Pain in left shoulder: Secondary | ICD-10-CM

## 2013-02-14 DIAGNOSIS — R609 Edema, unspecified: Secondary | ICD-10-CM

## 2013-10-12 ENCOUNTER — Other Ambulatory Visit: Payer: Self-pay | Admitting: Preventative Medicine

## 2013-10-12 DIAGNOSIS — M4800 Spinal stenosis, site unspecified: Secondary | ICD-10-CM

## 2013-10-12 DIAGNOSIS — IMO0002 Reserved for concepts with insufficient information to code with codable children: Secondary | ICD-10-CM

## 2013-10-20 ENCOUNTER — Ambulatory Visit
Admission: RE | Admit: 2013-10-20 | Discharge: 2013-10-20 | Disposition: A | Discharge status: Home / Self Care | Payer: Worker's Compensation | Source: Ambulatory Visit | Attending: Preventative Medicine | Admitting: Preventative Medicine

## 2013-10-20 DIAGNOSIS — IMO0002 Reserved for concepts with insufficient information to code with codable children: Secondary | ICD-10-CM

## 2013-10-20 DIAGNOSIS — M4800 Spinal stenosis, site unspecified: Secondary | ICD-10-CM

## 2013-10-20 MED ORDER — METHYLPREDNISOLONE ACETATE 40 MG/ML INJ SUSP (RADIOLOG
120.0000 mg | Freq: Once | INTRAMUSCULAR | Status: AC
  Administered 2013-10-20: 120 mg via EPIDURAL

## 2013-10-20 MED ORDER — IOHEXOL 180 MG/ML  SOLN
1.0000 mL | Freq: Once | INTRAMUSCULAR | Status: AC | PRN
  Administered 2013-10-20: 1 mL via INTRAVENOUS

## 2013-10-20 NOTE — Discharge Instructions (Signed)

## 2013-11-18 ENCOUNTER — Other Ambulatory Visit: Payer: Self-pay | Admitting: Preventative Medicine

## 2013-11-18 DIAGNOSIS — M545 Low back pain: Secondary | ICD-10-CM

## 2013-12-11 ENCOUNTER — Ambulatory Visit
Admission: RE | Admit: 2013-12-11 | Discharge: 2013-12-11 | Disposition: A | Discharge status: Home / Self Care | Payer: Worker's Compensation | Source: Ambulatory Visit | Attending: Preventative Medicine | Admitting: Preventative Medicine

## 2013-12-11 DIAGNOSIS — M545 Low back pain: Secondary | ICD-10-CM

## 2013-12-11 MED ORDER — IOHEXOL 180 MG/ML  SOLN
1.0000 mL | Freq: Once | INTRAMUSCULAR | Status: AC | PRN
  Administered 2013-12-11: 1 mL via EPIDURAL

## 2013-12-11 MED ORDER — METHYLPREDNISOLONE ACETATE 40 MG/ML INJ SUSP (RADIOLOG
120.0000 mg | Freq: Once | INTRAMUSCULAR | Status: AC
  Administered 2013-12-11: 120 mg via EPIDURAL

## 2013-12-21 ENCOUNTER — Other Ambulatory Visit: Payer: Self-pay | Admitting: Preventative Medicine

## 2013-12-21 DIAGNOSIS — M545 Low back pain: Secondary | ICD-10-CM

## 2013-12-25 ENCOUNTER — Ambulatory Visit
Admission: RE | Admit: 2013-12-25 | Discharge: 2013-12-25 | Disposition: A | Discharge status: Home / Self Care | Payer: Worker's Compensation | Source: Ambulatory Visit | Attending: Preventative Medicine | Admitting: Preventative Medicine

## 2013-12-25 DIAGNOSIS — M545 Low back pain: Secondary | ICD-10-CM

## 2013-12-25 MED ORDER — IOHEXOL 180 MG/ML  SOLN
1.0000 mL | Freq: Once | INTRAMUSCULAR | Status: AC | PRN
  Administered 2013-12-25: 1 mL via EPIDURAL

## 2013-12-25 MED ORDER — METHYLPREDNISOLONE ACETATE 40 MG/ML INJ SUSP (RADIOLOG
120.0000 mg | Freq: Once | INTRAMUSCULAR | Status: AC
  Administered 2013-12-25: 120 mg via EPIDURAL

## 2013-12-25 NOTE — Discharge Instructions (Signed)

## 2014-05-04 ENCOUNTER — Other Ambulatory Visit: Payer: Self-pay | Admitting: Orthopedic Surgery

## 2014-05-04 DIAGNOSIS — M79606 Pain in leg, unspecified: Secondary | ICD-10-CM

## 2014-05-05 ENCOUNTER — Other Ambulatory Visit: Payer: PRIVATE HEALTH INSURANCE

## 2014-05-12 ENCOUNTER — Other Ambulatory Visit: Payer: Self-pay | Admitting: Orthopedic Surgery

## 2014-05-12 ENCOUNTER — Ambulatory Visit
Admission: RE | Admit: 2014-05-12 | Discharge: 2014-05-12 | Disposition: A | Discharge status: Home / Self Care | Payer: PRIVATE HEALTH INSURANCE | Source: Ambulatory Visit | Attending: Orthopedic Surgery | Admitting: Orthopedic Surgery

## 2014-05-12 DIAGNOSIS — M79604 Pain in right leg: Secondary | ICD-10-CM

## 2014-05-12 DIAGNOSIS — M79606 Pain in leg, unspecified: Secondary | ICD-10-CM

## 2016-02-03 ENCOUNTER — Encounter (HOSPITAL_COMMUNITY): Payer: Self-pay

## 2016-11-15 ENCOUNTER — Encounter (HOSPITAL_COMMUNITY): Payer: Self-pay

## 2019-05-07 DIAGNOSIS — D485 Neoplasm of uncertain behavior of skin: Secondary | ICD-10-CM | POA: Diagnosis not present

## 2019-05-07 DIAGNOSIS — L080 Pyoderma: Secondary | ICD-10-CM | POA: Diagnosis not present

## 2019-05-13 DIAGNOSIS — U071 COVID-19: Secondary | ICD-10-CM | POA: Diagnosis not present

## 2019-11-03 DIAGNOSIS — Z6826 Body mass index (BMI) 26.0-26.9, adult: Secondary | ICD-10-CM | POA: Diagnosis not present

## 2019-11-03 DIAGNOSIS — I10 Essential (primary) hypertension: Secondary | ICD-10-CM | POA: Diagnosis not present

## 2019-11-03 DIAGNOSIS — Z0000 Encounter for general adult medical examination without abnormal findings: Secondary | ICD-10-CM | POA: Diagnosis not present

## 2019-11-03 DIAGNOSIS — S0033XA Contusion of nose, initial encounter: Secondary | ICD-10-CM | POA: Diagnosis not present

## 2019-11-03 DIAGNOSIS — M810 Age-related osteoporosis without current pathological fracture: Secondary | ICD-10-CM | POA: Diagnosis not present

## 2019-11-03 DIAGNOSIS — Z1389 Encounter for screening for other disorder: Secondary | ICD-10-CM | POA: Diagnosis not present

## 2019-12-30 DIAGNOSIS — I10 Essential (primary) hypertension: Secondary | ICD-10-CM | POA: Diagnosis not present

## 2019-12-30 DIAGNOSIS — M545 Low back pain: Secondary | ICD-10-CM | POA: Diagnosis not present

## 2019-12-30 DIAGNOSIS — Z6827 Body mass index (BMI) 27.0-27.9, adult: Secondary | ICD-10-CM | POA: Diagnosis not present

## 2020-01-05 DIAGNOSIS — S336XXA Sprain of sacroiliac joint, initial encounter: Secondary | ICD-10-CM | POA: Diagnosis not present

## 2020-01-05 DIAGNOSIS — M9903 Segmental and somatic dysfunction of lumbar region: Secondary | ICD-10-CM | POA: Diagnosis not present

## 2020-01-08 DIAGNOSIS — S336XXA Sprain of sacroiliac joint, initial encounter: Secondary | ICD-10-CM | POA: Diagnosis not present

## 2020-01-08 DIAGNOSIS — M9903 Segmental and somatic dysfunction of lumbar region: Secondary | ICD-10-CM | POA: Diagnosis not present

## 2020-01-11 DIAGNOSIS — S336XXA Sprain of sacroiliac joint, initial encounter: Secondary | ICD-10-CM | POA: Diagnosis not present

## 2020-01-11 DIAGNOSIS — M9903 Segmental and somatic dysfunction of lumbar region: Secondary | ICD-10-CM | POA: Diagnosis not present

## 2020-01-13 DIAGNOSIS — S336XXA Sprain of sacroiliac joint, initial encounter: Secondary | ICD-10-CM | POA: Diagnosis not present

## 2020-01-13 DIAGNOSIS — M9903 Segmental and somatic dysfunction of lumbar region: Secondary | ICD-10-CM | POA: Diagnosis not present

## 2020-01-18 DIAGNOSIS — L570 Actinic keratosis: Secondary | ICD-10-CM | POA: Diagnosis not present

## 2020-01-18 DIAGNOSIS — L814 Other melanin hyperpigmentation: Secondary | ICD-10-CM | POA: Diagnosis not present

## 2020-01-18 DIAGNOSIS — D1801 Hemangioma of skin and subcutaneous tissue: Secondary | ICD-10-CM | POA: Diagnosis not present

## 2020-01-18 DIAGNOSIS — L821 Other seborrheic keratosis: Secondary | ICD-10-CM | POA: Diagnosis not present

## 2020-01-18 DIAGNOSIS — D485 Neoplasm of uncertain behavior of skin: Secondary | ICD-10-CM | POA: Diagnosis not present

## 2020-01-18 DIAGNOSIS — D0359 Melanoma in situ of other part of trunk: Secondary | ICD-10-CM | POA: Diagnosis not present

## 2020-01-19 DIAGNOSIS — S336XXA Sprain of sacroiliac joint, initial encounter: Secondary | ICD-10-CM | POA: Diagnosis not present

## 2020-01-19 DIAGNOSIS — M9903 Segmental and somatic dysfunction of lumbar region: Secondary | ICD-10-CM | POA: Diagnosis not present

## 2020-02-04 DIAGNOSIS — D0359 Melanoma in situ of other part of trunk: Secondary | ICD-10-CM | POA: Diagnosis not present

## 2020-02-15 DIAGNOSIS — S336XXA Sprain of sacroiliac joint, initial encounter: Secondary | ICD-10-CM | POA: Diagnosis not present

## 2020-02-15 DIAGNOSIS — M9903 Segmental and somatic dysfunction of lumbar region: Secondary | ICD-10-CM | POA: Diagnosis not present

## 2020-02-18 DIAGNOSIS — S336XXA Sprain of sacroiliac joint, initial encounter: Secondary | ICD-10-CM | POA: Diagnosis not present

## 2020-02-18 DIAGNOSIS — M9903 Segmental and somatic dysfunction of lumbar region: Secondary | ICD-10-CM | POA: Diagnosis not present

## 2020-02-22 DIAGNOSIS — S336XXA Sprain of sacroiliac joint, initial encounter: Secondary | ICD-10-CM | POA: Diagnosis not present

## 2020-02-22 DIAGNOSIS — M9903 Segmental and somatic dysfunction of lumbar region: Secondary | ICD-10-CM | POA: Diagnosis not present

## 2020-02-23 DIAGNOSIS — M1712 Unilateral primary osteoarthritis, left knee: Secondary | ICD-10-CM | POA: Diagnosis not present

## 2020-02-25 DIAGNOSIS — S336XXA Sprain of sacroiliac joint, initial encounter: Secondary | ICD-10-CM | POA: Diagnosis not present

## 2020-02-25 DIAGNOSIS — M9903 Segmental and somatic dysfunction of lumbar region: Secondary | ICD-10-CM | POA: Diagnosis not present

## 2020-03-01 DIAGNOSIS — S336XXA Sprain of sacroiliac joint, initial encounter: Secondary | ICD-10-CM | POA: Diagnosis not present

## 2020-03-01 DIAGNOSIS — M9903 Segmental and somatic dysfunction of lumbar region: Secondary | ICD-10-CM | POA: Diagnosis not present

## 2020-03-03 DIAGNOSIS — S336XXA Sprain of sacroiliac joint, initial encounter: Secondary | ICD-10-CM | POA: Diagnosis not present

## 2020-03-03 DIAGNOSIS — M9903 Segmental and somatic dysfunction of lumbar region: Secondary | ICD-10-CM | POA: Diagnosis not present

## 2020-03-08 DIAGNOSIS — M9903 Segmental and somatic dysfunction of lumbar region: Secondary | ICD-10-CM | POA: Diagnosis not present

## 2020-03-08 DIAGNOSIS — S336XXA Sprain of sacroiliac joint, initial encounter: Secondary | ICD-10-CM | POA: Diagnosis not present

## 2020-03-09 ENCOUNTER — Encounter: Payer: Self-pay | Admitting: Internal Medicine

## 2020-03-10 ENCOUNTER — Encounter: Payer: Self-pay | Admitting: Internal Medicine

## 2020-03-10 DIAGNOSIS — S336XXA Sprain of sacroiliac joint, initial encounter: Secondary | ICD-10-CM | POA: Diagnosis not present

## 2020-03-10 DIAGNOSIS — M9903 Segmental and somatic dysfunction of lumbar region: Secondary | ICD-10-CM | POA: Diagnosis not present

## 2020-03-17 DIAGNOSIS — S336XXA Sprain of sacroiliac joint, initial encounter: Secondary | ICD-10-CM | POA: Diagnosis not present

## 2020-03-17 DIAGNOSIS — M9903 Segmental and somatic dysfunction of lumbar region: Secondary | ICD-10-CM | POA: Diagnosis not present

## 2020-03-28 DIAGNOSIS — M9903 Segmental and somatic dysfunction of lumbar region: Secondary | ICD-10-CM | POA: Diagnosis not present

## 2020-03-28 DIAGNOSIS — S336XXA Sprain of sacroiliac joint, initial encounter: Secondary | ICD-10-CM | POA: Diagnosis not present

## 2020-04-04 DIAGNOSIS — M9903 Segmental and somatic dysfunction of lumbar region: Secondary | ICD-10-CM | POA: Diagnosis not present

## 2020-04-04 DIAGNOSIS — S336XXA Sprain of sacroiliac joint, initial encounter: Secondary | ICD-10-CM | POA: Diagnosis not present

## 2020-04-18 DIAGNOSIS — S336XXA Sprain of sacroiliac joint, initial encounter: Secondary | ICD-10-CM | POA: Diagnosis not present

## 2020-04-18 DIAGNOSIS — M9903 Segmental and somatic dysfunction of lumbar region: Secondary | ICD-10-CM | POA: Diagnosis not present

## 2020-05-02 DIAGNOSIS — M9903 Segmental and somatic dysfunction of lumbar region: Secondary | ICD-10-CM | POA: Diagnosis not present

## 2020-05-02 DIAGNOSIS — S336XXA Sprain of sacroiliac joint, initial encounter: Secondary | ICD-10-CM | POA: Diagnosis not present

## 2020-05-10 DIAGNOSIS — I10 Essential (primary) hypertension: Secondary | ICD-10-CM | POA: Diagnosis not present

## 2020-05-10 DIAGNOSIS — Z6825 Body mass index (BMI) 25.0-25.9, adult: Secondary | ICD-10-CM | POA: Diagnosis not present

## 2020-05-10 DIAGNOSIS — M545 Low back pain, unspecified: Secondary | ICD-10-CM | POA: Diagnosis not present

## 2020-05-10 DIAGNOSIS — F3340 Major depressive disorder, recurrent, in remission, unspecified: Secondary | ICD-10-CM | POA: Diagnosis not present

## 2020-05-19 DIAGNOSIS — M9903 Segmental and somatic dysfunction of lumbar region: Secondary | ICD-10-CM | POA: Diagnosis not present

## 2020-05-19 DIAGNOSIS — S336XXA Sprain of sacroiliac joint, initial encounter: Secondary | ICD-10-CM | POA: Diagnosis not present

## 2020-05-20 DIAGNOSIS — M545 Low back pain, unspecified: Secondary | ICD-10-CM | POA: Diagnosis not present

## 2020-05-24 DIAGNOSIS — S336XXA Sprain of sacroiliac joint, initial encounter: Secondary | ICD-10-CM | POA: Diagnosis not present

## 2020-05-24 DIAGNOSIS — M9903 Segmental and somatic dysfunction of lumbar region: Secondary | ICD-10-CM | POA: Diagnosis not present

## 2020-05-27 ENCOUNTER — Other Ambulatory Visit: Payer: Self-pay | Admitting: Internal Medicine

## 2020-05-27 DIAGNOSIS — M545 Low back pain, unspecified: Secondary | ICD-10-CM

## 2020-05-31 DIAGNOSIS — M9903 Segmental and somatic dysfunction of lumbar region: Secondary | ICD-10-CM | POA: Diagnosis not present

## 2020-05-31 DIAGNOSIS — S336XXA Sprain of sacroiliac joint, initial encounter: Secondary | ICD-10-CM | POA: Diagnosis not present

## 2020-06-03 DIAGNOSIS — S336XXA Sprain of sacroiliac joint, initial encounter: Secondary | ICD-10-CM | POA: Diagnosis not present

## 2020-06-03 DIAGNOSIS — M9903 Segmental and somatic dysfunction of lumbar region: Secondary | ICD-10-CM | POA: Diagnosis not present

## 2020-06-06 DIAGNOSIS — M9903 Segmental and somatic dysfunction of lumbar region: Secondary | ICD-10-CM | POA: Diagnosis not present

## 2020-06-06 DIAGNOSIS — S336XXA Sprain of sacroiliac joint, initial encounter: Secondary | ICD-10-CM | POA: Diagnosis not present

## 2020-06-09 DIAGNOSIS — M9903 Segmental and somatic dysfunction of lumbar region: Secondary | ICD-10-CM | POA: Diagnosis not present

## 2020-06-09 DIAGNOSIS — S336XXA Sprain of sacroiliac joint, initial encounter: Secondary | ICD-10-CM | POA: Diagnosis not present

## 2020-06-13 DIAGNOSIS — M9903 Segmental and somatic dysfunction of lumbar region: Secondary | ICD-10-CM | POA: Diagnosis not present

## 2020-06-13 DIAGNOSIS — S336XXA Sprain of sacroiliac joint, initial encounter: Secondary | ICD-10-CM | POA: Diagnosis not present

## 2020-06-15 DIAGNOSIS — M9903 Segmental and somatic dysfunction of lumbar region: Secondary | ICD-10-CM | POA: Diagnosis not present

## 2020-06-15 DIAGNOSIS — S336XXA Sprain of sacroiliac joint, initial encounter: Secondary | ICD-10-CM | POA: Diagnosis not present

## 2020-06-16 ENCOUNTER — Other Ambulatory Visit: Payer: Self-pay

## 2020-06-16 ENCOUNTER — Ambulatory Visit
Admission: RE | Admit: 2020-06-16 | Discharge: 2020-06-16 | Disposition: A | Discharge status: Home / Self Care | Payer: PRIVATE HEALTH INSURANCE | Source: Ambulatory Visit | Attending: Internal Medicine | Admitting: Internal Medicine

## 2020-06-16 DIAGNOSIS — M48061 Spinal stenosis, lumbar region without neurogenic claudication: Secondary | ICD-10-CM | POA: Diagnosis not present

## 2020-06-16 DIAGNOSIS — M545 Low back pain, unspecified: Secondary | ICD-10-CM

## 2020-06-20 DIAGNOSIS — M9903 Segmental and somatic dysfunction of lumbar region: Secondary | ICD-10-CM | POA: Diagnosis not present

## 2020-06-20 DIAGNOSIS — S336XXA Sprain of sacroiliac joint, initial encounter: Secondary | ICD-10-CM | POA: Diagnosis not present

## 2020-06-21 ENCOUNTER — Other Ambulatory Visit: Payer: Self-pay | Admitting: Internal Medicine

## 2020-06-21 DIAGNOSIS — G8929 Other chronic pain: Secondary | ICD-10-CM

## 2020-06-23 ENCOUNTER — Ambulatory Visit
Admission: RE | Admit: 2020-06-23 | Discharge: 2020-06-23 | Disposition: A | Discharge status: Home / Self Care | Payer: Medicare PPO | Source: Ambulatory Visit | Attending: Internal Medicine | Admitting: Internal Medicine

## 2020-06-23 ENCOUNTER — Other Ambulatory Visit: Payer: Self-pay

## 2020-06-23 DIAGNOSIS — M5126 Other intervertebral disc displacement, lumbar region: Secondary | ICD-10-CM | POA: Diagnosis not present

## 2020-06-23 DIAGNOSIS — M545 Low back pain, unspecified: Secondary | ICD-10-CM

## 2020-06-23 DIAGNOSIS — G8929 Other chronic pain: Secondary | ICD-10-CM

## 2020-06-23 DIAGNOSIS — M48061 Spinal stenosis, lumbar region without neurogenic claudication: Secondary | ICD-10-CM | POA: Diagnosis not present

## 2020-06-23 MED ORDER — IOPAMIDOL (ISOVUE-M 200) INJECTION 41%
1.0000 mL | Freq: Once | INTRAMUSCULAR | Status: AC
  Administered 2020-06-23: 1 mL via EPIDURAL

## 2020-06-23 MED ORDER — METHYLPREDNISOLONE ACETATE 40 MG/ML INJ SUSP (RADIOLOG
120.0000 mg | Freq: Once | INTRAMUSCULAR | Status: AC
  Administered 2020-06-23: 120 mg via EPIDURAL

## 2020-06-23 NOTE — Discharge Instructions (Signed)

## 2020-07-04 DIAGNOSIS — M810 Age-related osteoporosis without current pathological fracture: Secondary | ICD-10-CM | POA: Diagnosis not present

## 2020-07-12 DIAGNOSIS — M9903 Segmental and somatic dysfunction of lumbar region: Secondary | ICD-10-CM | POA: Diagnosis not present

## 2020-07-12 DIAGNOSIS — S336XXA Sprain of sacroiliac joint, initial encounter: Secondary | ICD-10-CM | POA: Diagnosis not present

## 2020-07-15 DIAGNOSIS — M9903 Segmental and somatic dysfunction of lumbar region: Secondary | ICD-10-CM | POA: Diagnosis not present

## 2020-07-15 DIAGNOSIS — S336XXA Sprain of sacroiliac joint, initial encounter: Secondary | ICD-10-CM | POA: Diagnosis not present

## 2020-07-19 DIAGNOSIS — S336XXA Sprain of sacroiliac joint, initial encounter: Secondary | ICD-10-CM | POA: Diagnosis not present

## 2020-07-19 DIAGNOSIS — M9903 Segmental and somatic dysfunction of lumbar region: Secondary | ICD-10-CM | POA: Diagnosis not present

## 2020-07-22 DIAGNOSIS — S336XXA Sprain of sacroiliac joint, initial encounter: Secondary | ICD-10-CM | POA: Diagnosis not present

## 2020-07-22 DIAGNOSIS — M9903 Segmental and somatic dysfunction of lumbar region: Secondary | ICD-10-CM | POA: Diagnosis not present

## 2020-07-27 DIAGNOSIS — M9903 Segmental and somatic dysfunction of lumbar region: Secondary | ICD-10-CM | POA: Diagnosis not present

## 2020-07-27 DIAGNOSIS — S336XXA Sprain of sacroiliac joint, initial encounter: Secondary | ICD-10-CM | POA: Diagnosis not present

## 2020-08-03 DIAGNOSIS — Z1283 Encounter for screening for malignant neoplasm of skin: Secondary | ICD-10-CM | POA: Diagnosis not present

## 2020-08-03 DIAGNOSIS — I781 Nevus, non-neoplastic: Secondary | ICD-10-CM | POA: Diagnosis not present

## 2020-08-03 DIAGNOSIS — Z85820 Personal history of malignant melanoma of skin: Secondary | ICD-10-CM | POA: Diagnosis not present

## 2020-08-03 DIAGNOSIS — L570 Actinic keratosis: Secondary | ICD-10-CM | POA: Diagnosis not present

## 2020-08-04 DIAGNOSIS — M9903 Segmental and somatic dysfunction of lumbar region: Secondary | ICD-10-CM | POA: Diagnosis not present

## 2020-08-04 DIAGNOSIS — S336XXA Sprain of sacroiliac joint, initial encounter: Secondary | ICD-10-CM | POA: Diagnosis not present

## 2020-08-11 DIAGNOSIS — S336XXA Sprain of sacroiliac joint, initial encounter: Secondary | ICD-10-CM | POA: Diagnosis not present

## 2020-08-11 DIAGNOSIS — M9903 Segmental and somatic dysfunction of lumbar region: Secondary | ICD-10-CM | POA: Diagnosis not present

## 2020-08-15 DIAGNOSIS — M1712 Unilateral primary osteoarthritis, left knee: Secondary | ICD-10-CM | POA: Diagnosis not present

## 2020-08-18 DIAGNOSIS — S336XXA Sprain of sacroiliac joint, initial encounter: Secondary | ICD-10-CM | POA: Diagnosis not present

## 2020-08-18 DIAGNOSIS — M9903 Segmental and somatic dysfunction of lumbar region: Secondary | ICD-10-CM | POA: Diagnosis not present

## 2020-08-25 DIAGNOSIS — M9903 Segmental and somatic dysfunction of lumbar region: Secondary | ICD-10-CM | POA: Diagnosis not present

## 2020-08-25 DIAGNOSIS — S336XXA Sprain of sacroiliac joint, initial encounter: Secondary | ICD-10-CM | POA: Diagnosis not present

## 2020-09-01 DIAGNOSIS — M9903 Segmental and somatic dysfunction of lumbar region: Secondary | ICD-10-CM | POA: Diagnosis not present

## 2020-09-01 DIAGNOSIS — S336XXA Sprain of sacroiliac joint, initial encounter: Secondary | ICD-10-CM | POA: Diagnosis not present

## 2020-09-07 DIAGNOSIS — Z0000 Encounter for general adult medical examination without abnormal findings: Secondary | ICD-10-CM | POA: Diagnosis not present

## 2020-09-07 DIAGNOSIS — M545 Low back pain, unspecified: Secondary | ICD-10-CM | POA: Diagnosis not present

## 2020-09-07 DIAGNOSIS — I10 Essential (primary) hypertension: Secondary | ICD-10-CM | POA: Diagnosis not present

## 2020-09-07 DIAGNOSIS — F3340 Major depressive disorder, recurrent, in remission, unspecified: Secondary | ICD-10-CM | POA: Diagnosis not present

## 2020-09-07 DIAGNOSIS — Z6825 Body mass index (BMI) 25.0-25.9, adult: Secondary | ICD-10-CM | POA: Diagnosis not present

## 2020-09-08 DIAGNOSIS — M9903 Segmental and somatic dysfunction of lumbar region: Secondary | ICD-10-CM | POA: Diagnosis not present

## 2020-09-08 DIAGNOSIS — S336XXA Sprain of sacroiliac joint, initial encounter: Secondary | ICD-10-CM | POA: Diagnosis not present

## 2020-09-15 DIAGNOSIS — M9903 Segmental and somatic dysfunction of lumbar region: Secondary | ICD-10-CM | POA: Diagnosis not present

## 2020-09-15 DIAGNOSIS — S336XXA Sprain of sacroiliac joint, initial encounter: Secondary | ICD-10-CM | POA: Diagnosis not present

## 2020-09-19 DIAGNOSIS — M2042 Other hammer toe(s) (acquired), left foot: Secondary | ICD-10-CM | POA: Diagnosis not present

## 2020-09-19 DIAGNOSIS — M2041 Other hammer toe(s) (acquired), right foot: Secondary | ICD-10-CM | POA: Diagnosis not present

## 2020-09-19 DIAGNOSIS — B351 Tinea unguium: Secondary | ICD-10-CM | POA: Diagnosis not present

## 2020-09-19 DIAGNOSIS — B070 Plantar wart: Secondary | ICD-10-CM | POA: Diagnosis not present

## 2020-09-21 DIAGNOSIS — M9903 Segmental and somatic dysfunction of lumbar region: Secondary | ICD-10-CM | POA: Diagnosis not present

## 2020-09-21 DIAGNOSIS — S336XXA Sprain of sacroiliac joint, initial encounter: Secondary | ICD-10-CM | POA: Diagnosis not present

## 2020-09-28 DIAGNOSIS — S336XXA Sprain of sacroiliac joint, initial encounter: Secondary | ICD-10-CM | POA: Diagnosis not present

## 2020-09-28 DIAGNOSIS — M9903 Segmental and somatic dysfunction of lumbar region: Secondary | ICD-10-CM | POA: Diagnosis not present

## 2020-10-03 ENCOUNTER — Ambulatory Visit (INDEPENDENT_AMBULATORY_CARE_PROVIDER_SITE_OTHER): Payer: Medicare PPO | Admitting: Podiatry

## 2020-10-03 ENCOUNTER — Other Ambulatory Visit: Payer: Self-pay

## 2020-10-03 ENCOUNTER — Ambulatory Visit (INDEPENDENT_AMBULATORY_CARE_PROVIDER_SITE_OTHER): Payer: Medicare PPO

## 2020-10-03 DIAGNOSIS — M2041 Other hammer toe(s) (acquired), right foot: Secondary | ICD-10-CM

## 2020-10-03 DIAGNOSIS — M2042 Other hammer toe(s) (acquired), left foot: Secondary | ICD-10-CM

## 2020-10-03 DIAGNOSIS — B070 Plantar wart: Secondary | ICD-10-CM | POA: Diagnosis not present

## 2020-10-03 NOTE — Progress Notes (Signed)
   HPI: 70 y.o. female presenting today as a new patient for evaluation of symptomatic hammertoes to the bilateral fifth toes.  She is referred here from Dr. Elijah Birk, local podiatrist for evaluation.  Patient states she has been dealing with the symptomatic hammertoes for several years despite conservative treatments including wide shoes.  She would like to have her hammertoes addressed and surgically corrected since conservative treatments are not successful in providing any sort of lasting alleviation of her symptoms.  She is unable to ambulate in close toed shoes without pain  Past Medical History:  Diagnosis Date  . Anemia   . Arthritis   . Back pain   . Depression   . Fatigue   . GERD (gastroesophageal reflux disease)   . Hypertension    history of hypertension, no current meds for for 1 year      Objective: Physical Exam General: The patient is alert and oriented x3 in no acute distress.  Dermatology: Skin is cool, dry and supple bilateral lower extremities. Negative for open lesions or macerations.  Vascular: Palpable pedal pulses bilaterally. No edema or erythema noted. Capillary refill within normal limits.  Neurological: Epicritic and protective threshold grossly intact bilaterally.   Musculoskeletal Exam: All pedal and ankle joints range of motion within normal limits bilateral. Muscle strength 5/5 in all groups bilateral.  Rigid hammertoe contracture deformity noted to digits fifth digits bilateral   Radiographic Exam: Hammertoe contracture deformity noted to the interphalangeal joints of the respective hammertoe digits mentioned on clinical musculoskeletal exam.  There is also enlargement of the head of the proximal phalanx noted to the left fifth toe   Assessment: 1.  Hammertoe fifth digits bilateral   Plan of Care:  1. Patient evaluated. X-Rays reviewed.  2. Today we discussed the conservative versus surgical management of the presenting pathology. The patient opts for  surgical management. All possible complications and details of the procedure were explained. All patient questions were answered. No guarantees were expressed or implied. 3. Authorization for surgery was initiated today. Surgery will consist of PIPJ arthroplasty with derotational skin plasty fifth digits bilateral 4.  Return to clinic 1 week postop  *Lives in [place], [place].  Husband of 51 yrs recently passed away in 02-09-2020     Felecia Shelling, DPM [facility]  Dr. Felecia Shelling, DPM    [address] [place], [place] [address]                Office [phone]  Fax [phone]

## 2020-10-05 DIAGNOSIS — S336XXA Sprain of sacroiliac joint, initial encounter: Secondary | ICD-10-CM | POA: Diagnosis not present

## 2020-10-05 DIAGNOSIS — M9903 Segmental and somatic dysfunction of lumbar region: Secondary | ICD-10-CM | POA: Diagnosis not present

## 2020-10-10 ENCOUNTER — Telehealth: Payer: Self-pay | Admitting: Urology

## 2020-10-10 NOTE — Telephone Encounter (Signed)
DOS - 11/03/20  HAMMERTOE REPAIR 5TH BILAT --- [id number]   [employer] EFFECTIVE DATE - 02/28/14   PER [employer] SITE CPT CODE 27614 X'S 2 HAS BEEN APPROVED, AUTH # [id number].

## 2020-10-17 DIAGNOSIS — B351 Tinea unguium: Secondary | ICD-10-CM | POA: Diagnosis not present

## 2020-10-17 DIAGNOSIS — B070 Plantar wart: Secondary | ICD-10-CM | POA: Diagnosis not present

## 2020-10-28 DIAGNOSIS — S336XXA Sprain of sacroiliac joint, initial encounter: Secondary | ICD-10-CM | POA: Diagnosis not present

## 2020-10-28 DIAGNOSIS — M9903 Segmental and somatic dysfunction of lumbar region: Secondary | ICD-10-CM | POA: Diagnosis not present

## 2020-11-01 DIAGNOSIS — B070 Plantar wart: Secondary | ICD-10-CM | POA: Diagnosis not present

## 2020-11-03 ENCOUNTER — Other Ambulatory Visit: Payer: Self-pay | Admitting: Podiatry

## 2020-11-03 ENCOUNTER — Encounter: Payer: Self-pay | Admitting: Podiatry

## 2020-11-03 ENCOUNTER — Telehealth: Payer: Self-pay | Admitting: *Deleted

## 2020-11-03 DIAGNOSIS — M2041 Other hammer toe(s) (acquired), right foot: Secondary | ICD-10-CM | POA: Diagnosis not present

## 2020-11-03 DIAGNOSIS — M2042 Other hammer toe(s) (acquired), left foot: Secondary | ICD-10-CM | POA: Diagnosis not present

## 2020-11-03 MED ORDER — HYDROCODONE-ACETAMINOPHEN 5-325 MG PO TABS: 1.0000 | ORAL_TABLET | Freq: Four times a day (QID) | ORAL | 0 refills | Status: AC | PRN

## 2020-11-03 NOTE — Telephone Encounter (Signed)
Returned call to patient's son,no answer, left vmessage  that the pain medication has been sent pharmacy on file. Called patient and gave information of the pain medicine as well.

## 2020-11-03 NOTE — Telephone Encounter (Signed)
Patient's son is calling for status of a pain medicine that was supposed to be sent to pharmacy for his mother Please advise.

## 2020-11-03 NOTE — Progress Notes (Signed)
Needs pain meds called in after surgery. Sent vicodin

## 2020-11-09 ENCOUNTER — Ambulatory Visit (INDEPENDENT_AMBULATORY_CARE_PROVIDER_SITE_OTHER): Payer: Medicare PPO | Admitting: Podiatry

## 2020-11-09 ENCOUNTER — Ambulatory Visit (INDEPENDENT_AMBULATORY_CARE_PROVIDER_SITE_OTHER): Payer: Medicare PPO

## 2020-11-09 ENCOUNTER — Other Ambulatory Visit: Payer: Self-pay

## 2020-11-09 DIAGNOSIS — Z9889 Other specified postprocedural states: Secondary | ICD-10-CM

## 2020-11-09 NOTE — Progress Notes (Signed)
   Subjective:  Patient presents today status post fifth toe arthroplasty. DOS: 11/03/2020.  Patient states that she is doing very well.  She has minimal pain.  She is weightbearing in surgical shoes as instructed.  No new complaints at this time  Past Medical History:  Diagnosis Date   Anemia    Arthritis    Back pain    Depression    Fatigue    GERD (gastroesophageal reflux disease)    Hypertension    history of hypertension, no current meds for for 1 year      Objective/Physical Exam Neurovascular status intact.  Skin incisions appear to be well coapted with sutures intact. No sign of infectious process noted. No dehiscence. No active bleeding noted. Moderate edema noted to the surgical extremity.  Radiographic Exam:  Osteotomies sites appear to be stable with routine healing.  Assessment: 1. s/p fifth toe arthroplasty bilateral. DOS: 11/03/2020   Plan of Care:  1. Patient was evaluated. X-rays reviewed 2.  Patient may give may begin washing and showering and getting the foot wet. 3.  Continue minimal weightbearing.  She can wear any shoes that do not irritate or aggravate the toes 4.  Return to clinic in 1 week for suture removal  *Lives in [place], [place].  Husband of 65yrs recently passed away in 03/06/20  Felecia Shelling, DPM [facility]  Dr. Felecia Shelling, DPM    [address] [place], [place] [address]                Office [phone]  Fax [phone]

## 2020-11-15 DIAGNOSIS — B351 Tinea unguium: Secondary | ICD-10-CM | POA: Diagnosis not present

## 2020-11-16 ENCOUNTER — Ambulatory Visit (INDEPENDENT_AMBULATORY_CARE_PROVIDER_SITE_OTHER): Payer: Medicare PPO | Admitting: Podiatry

## 2020-11-16 ENCOUNTER — Other Ambulatory Visit: Payer: Self-pay

## 2020-11-16 DIAGNOSIS — Z9889 Other specified postprocedural states: Secondary | ICD-10-CM

## 2020-11-16 DIAGNOSIS — M2041 Other hammer toe(s) (acquired), right foot: Secondary | ICD-10-CM

## 2020-11-16 DIAGNOSIS — M2042 Other hammer toe(s) (acquired), left foot: Secondary | ICD-10-CM

## 2020-11-16 NOTE — Progress Notes (Signed)
   Subjective:  Patient presents today status post fifth toe arthroplasty. DOS: 11/03/2020.  Patient continues to do very well.  She has been weightbearing as tolerated.  No new complaints at this time Past Medical History:  Diagnosis Date   Anemia    Arthritis    Back pain    Depression    Fatigue    GERD (gastroesophageal reflux disease)    Hypertension    history of hypertension, no current meds for for 1 year      Objective/Physical Exam Neurovascular status intact.  Skin incisions appear to be well coapted with sutures intact. No sign of infectious process noted. No dehiscence. No active bleeding noted. Moderate edema noted to the fifth toes bilateral  Assessment: 1. s/p fifth toe arthroplasty bilateral. DOS: 11/03/2020   Plan of Care:  1. Patient was evaluated.  2.  Sutures removed today 3.  Patient may slowly increase to full activity no restrictions.  Recommend shoes that do not constrict the toes or aggravate the surgical sites 4.  Return to clinic as needed  *Lives in [place], [place].  Husband of 29yrs recently passed away in 02/16/20  Felecia Shelling, DPM [facility]  Dr. Felecia Shelling, DPM    [address] [place], [place] [address]                Office [phone]  Fax [phone]

## 2020-11-21 ENCOUNTER — Encounter: Payer: Medicare PPO | Admitting: Podiatry

## 2020-11-23 ENCOUNTER — Encounter: Payer: Medicare PPO | Admitting: Podiatry

## 2020-12-05 ENCOUNTER — Encounter: Payer: Medicare PPO | Admitting: Podiatry

## 2020-12-05 DIAGNOSIS — B070 Plantar wart: Secondary | ICD-10-CM | POA: Diagnosis not present

## 2020-12-13 DIAGNOSIS — B351 Tinea unguium: Secondary | ICD-10-CM | POA: Diagnosis not present

## 2020-12-15 DIAGNOSIS — I83813 Varicose veins of bilateral lower extremities with pain: Secondary | ICD-10-CM | POA: Diagnosis not present

## 2020-12-15 DIAGNOSIS — I8311 Varicose veins of right lower extremity with inflammation: Secondary | ICD-10-CM | POA: Diagnosis not present

## 2020-12-15 DIAGNOSIS — Z1231 Encounter for screening mammogram for malignant neoplasm of breast: Secondary | ICD-10-CM | POA: Diagnosis not present

## 2020-12-15 DIAGNOSIS — I83893 Varicose veins of bilateral lower extremities with other complications: Secondary | ICD-10-CM | POA: Diagnosis not present

## 2020-12-15 DIAGNOSIS — I8312 Varicose veins of left lower extremity with inflammation: Secondary | ICD-10-CM | POA: Diagnosis not present

## 2020-12-20 DIAGNOSIS — B070 Plantar wart: Secondary | ICD-10-CM | POA: Diagnosis not present

## 2021-01-04 DIAGNOSIS — B070 Plantar wart: Secondary | ICD-10-CM | POA: Diagnosis not present

## 2021-01-11 DIAGNOSIS — Z6824 Body mass index (BMI) 24.0-24.9, adult: Secondary | ICD-10-CM | POA: Diagnosis not present

## 2021-01-11 DIAGNOSIS — R791 Abnormal coagulation profile: Secondary | ICD-10-CM | POA: Diagnosis not present

## 2021-01-11 DIAGNOSIS — F3340 Major depressive disorder, recurrent, in remission, unspecified: Secondary | ICD-10-CM | POA: Diagnosis not present

## 2021-01-11 DIAGNOSIS — K219 Gastro-esophageal reflux disease without esophagitis: Secondary | ICD-10-CM | POA: Diagnosis not present

## 2021-01-11 DIAGNOSIS — M810 Age-related osteoporosis without current pathological fracture: Secondary | ICD-10-CM | POA: Diagnosis not present

## 2021-01-11 DIAGNOSIS — I10 Essential (primary) hypertension: Secondary | ICD-10-CM | POA: Diagnosis not present

## 2021-01-11 DIAGNOSIS — M545 Low back pain, unspecified: Secondary | ICD-10-CM | POA: Diagnosis not present

## 2021-01-12 DIAGNOSIS — M1712 Unilateral primary osteoarthritis, left knee: Secondary | ICD-10-CM | POA: Diagnosis not present

## 2021-01-17 DIAGNOSIS — Z1283 Encounter for screening for malignant neoplasm of skin: Secondary | ICD-10-CM | POA: Diagnosis not present

## 2021-01-17 DIAGNOSIS — Z85820 Personal history of malignant melanoma of skin: Secondary | ICD-10-CM | POA: Diagnosis not present

## 2021-01-17 DIAGNOSIS — L570 Actinic keratosis: Secondary | ICD-10-CM | POA: Diagnosis not present

## 2021-01-18 DIAGNOSIS — I8311 Varicose veins of right lower extremity with inflammation: Secondary | ICD-10-CM | POA: Diagnosis not present

## 2021-01-18 DIAGNOSIS — I8312 Varicose veins of left lower extremity with inflammation: Secondary | ICD-10-CM | POA: Diagnosis not present

## 2021-01-20 DIAGNOSIS — I83893 Varicose veins of bilateral lower extremities with other complications: Secondary | ICD-10-CM | POA: Diagnosis not present

## 2021-01-20 DIAGNOSIS — I83813 Varicose veins of bilateral lower extremities with pain: Secondary | ICD-10-CM | POA: Diagnosis not present

## 2021-01-24 DIAGNOSIS — B070 Plantar wart: Secondary | ICD-10-CM | POA: Diagnosis not present

## 2021-01-25 DIAGNOSIS — M810 Age-related osteoporosis without current pathological fracture: Secondary | ICD-10-CM | POA: Diagnosis not present

## 2021-02-06 DIAGNOSIS — M79672 Pain in left foot: Secondary | ICD-10-CM | POA: Diagnosis not present

## 2021-02-06 DIAGNOSIS — B079 Viral wart, unspecified: Secondary | ICD-10-CM | POA: Diagnosis not present

## 2021-02-10 DIAGNOSIS — I8312 Varicose veins of left lower extremity with inflammation: Secondary | ICD-10-CM | POA: Diagnosis not present

## 2021-02-10 DIAGNOSIS — I83812 Varicose veins of left lower extremities with pain: Secondary | ICD-10-CM | POA: Diagnosis not present

## 2021-02-16 DIAGNOSIS — I8311 Varicose veins of right lower extremity with inflammation: Secondary | ICD-10-CM | POA: Diagnosis not present

## 2021-02-20 DIAGNOSIS — B079 Viral wart, unspecified: Secondary | ICD-10-CM | POA: Diagnosis not present

## 2021-02-20 DIAGNOSIS — L03031 Cellulitis of right toe: Secondary | ICD-10-CM | POA: Diagnosis not present

## 2021-03-06 DIAGNOSIS — L600 Ingrowing nail: Secondary | ICD-10-CM | POA: Diagnosis not present

## 2021-03-06 DIAGNOSIS — B079 Viral wart, unspecified: Secondary | ICD-10-CM | POA: Diagnosis not present

## 2021-03-08 DIAGNOSIS — I872 Venous insufficiency (chronic) (peripheral): Secondary | ICD-10-CM | POA: Diagnosis not present

## 2021-03-08 DIAGNOSIS — Z09 Encounter for follow-up examination after completed treatment for conditions other than malignant neoplasm: Secondary | ICD-10-CM | POA: Diagnosis not present

## 2021-03-08 DIAGNOSIS — I83892 Varicose veins of left lower extremities with other complications: Secondary | ICD-10-CM | POA: Diagnosis not present

## 2021-03-09 DIAGNOSIS — Z23 Encounter for immunization: Secondary | ICD-10-CM | POA: Diagnosis not present

## 2021-03-14 DIAGNOSIS — M8589 Other specified disorders of bone density and structure, multiple sites: Secondary | ICD-10-CM | POA: Diagnosis not present

## 2021-03-14 DIAGNOSIS — M810 Age-related osteoporosis without current pathological fracture: Secondary | ICD-10-CM | POA: Diagnosis not present

## 2021-03-20 DIAGNOSIS — B079 Viral wart, unspecified: Secondary | ICD-10-CM | POA: Diagnosis not present

## 2021-03-21 DIAGNOSIS — M47814 Spondylosis without myelopathy or radiculopathy, thoracic region: Secondary | ICD-10-CM | POA: Diagnosis not present

## 2021-03-21 DIAGNOSIS — M545 Low back pain, unspecified: Secondary | ICD-10-CM | POA: Diagnosis not present

## 2021-03-21 DIAGNOSIS — M8938 Hypertrophy of bone, other site: Secondary | ICD-10-CM | POA: Diagnosis not present

## 2021-03-21 DIAGNOSIS — M5136 Other intervertebral disc degeneration, lumbar region: Secondary | ICD-10-CM | POA: Diagnosis not present

## 2021-03-28 DIAGNOSIS — M25551 Pain in right hip: Secondary | ICD-10-CM | POA: Diagnosis not present

## 2021-03-28 DIAGNOSIS — M25552 Pain in left hip: Secondary | ICD-10-CM | POA: Diagnosis not present

## 2021-04-05 DIAGNOSIS — I872 Venous insufficiency (chronic) (peripheral): Secondary | ICD-10-CM | POA: Diagnosis not present

## 2021-04-05 DIAGNOSIS — M7989 Other specified soft tissue disorders: Secondary | ICD-10-CM | POA: Diagnosis not present

## 2021-04-05 DIAGNOSIS — I83892 Varicose veins of left lower extremities with other complications: Secondary | ICD-10-CM | POA: Diagnosis not present

## 2021-05-04 DIAGNOSIS — M79605 Pain in left leg: Secondary | ICD-10-CM | POA: Diagnosis not present

## 2021-05-04 DIAGNOSIS — Z09 Encounter for follow-up examination after completed treatment for conditions other than malignant neoplasm: Secondary | ICD-10-CM | POA: Diagnosis not present

## 2021-05-04 DIAGNOSIS — I83892 Varicose veins of left lower extremities with other complications: Secondary | ICD-10-CM | POA: Diagnosis not present

## 2021-05-17 DIAGNOSIS — K219 Gastro-esophageal reflux disease without esophagitis: Secondary | ICD-10-CM | POA: Diagnosis not present

## 2021-05-17 DIAGNOSIS — M5431 Sciatica, right side: Secondary | ICD-10-CM | POA: Diagnosis not present

## 2021-05-17 DIAGNOSIS — M545 Low back pain, unspecified: Secondary | ICD-10-CM | POA: Diagnosis not present

## 2021-05-17 DIAGNOSIS — F3340 Major depressive disorder, recurrent, in remission, unspecified: Secondary | ICD-10-CM | POA: Diagnosis not present

## 2021-05-17 DIAGNOSIS — Z6824 Body mass index (BMI) 24.0-24.9, adult: Secondary | ICD-10-CM | POA: Diagnosis not present

## 2021-05-17 DIAGNOSIS — I10 Essential (primary) hypertension: Secondary | ICD-10-CM | POA: Diagnosis not present

## 2021-05-17 DIAGNOSIS — Z0000 Encounter for general adult medical examination without abnormal findings: Secondary | ICD-10-CM | POA: Diagnosis not present

## 2021-05-17 DIAGNOSIS — M810 Age-related osteoporosis without current pathological fracture: Secondary | ICD-10-CM | POA: Diagnosis not present

## 2021-05-18 DIAGNOSIS — L600 Ingrowing nail: Secondary | ICD-10-CM | POA: Diagnosis not present

## 2021-05-22 DIAGNOSIS — I83891 Varicose veins of right lower extremities with other complications: Secondary | ICD-10-CM | POA: Diagnosis not present

## 2021-05-22 DIAGNOSIS — I83811 Varicose veins of right lower extremities with pain: Secondary | ICD-10-CM | POA: Diagnosis not present

## 2021-05-22 DIAGNOSIS — M7989 Other specified soft tissue disorders: Secondary | ICD-10-CM | POA: Diagnosis not present

## 2021-06-05 DIAGNOSIS — I83811 Varicose veins of right lower extremities with pain: Secondary | ICD-10-CM | POA: Diagnosis not present

## 2021-06-05 DIAGNOSIS — I83891 Varicose veins of right lower extremities with other complications: Secondary | ICD-10-CM | POA: Diagnosis not present

## 2021-06-21 DIAGNOSIS — I83891 Varicose veins of right lower extremities with other complications: Secondary | ICD-10-CM | POA: Diagnosis not present

## 2021-06-21 DIAGNOSIS — M7989 Other specified soft tissue disorders: Secondary | ICD-10-CM | POA: Diagnosis not present

## 2021-07-06 DIAGNOSIS — I10 Essential (primary) hypertension: Secondary | ICD-10-CM | POA: Diagnosis not present

## 2021-07-06 DIAGNOSIS — Z6824 Body mass index (BMI) 24.0-24.9, adult: Secondary | ICD-10-CM | POA: Diagnosis not present

## 2021-07-24 DIAGNOSIS — H0288B Meibomian gland dysfunction left eye, upper and lower eyelids: Secondary | ICD-10-CM | POA: Diagnosis not present

## 2021-07-24 DIAGNOSIS — H00021 Hordeolum internum right upper eyelid: Secondary | ICD-10-CM | POA: Diagnosis not present

## 2021-07-24 DIAGNOSIS — H0288A Meibomian gland dysfunction right eye, upper and lower eyelids: Secondary | ICD-10-CM | POA: Diagnosis not present

## 2021-08-17 DIAGNOSIS — H0011 Chalazion right upper eyelid: Secondary | ICD-10-CM | POA: Diagnosis not present

## 2021-09-13 DIAGNOSIS — M810 Age-related osteoporosis without current pathological fracture: Secondary | ICD-10-CM | POA: Diagnosis not present

## 2021-09-13 DIAGNOSIS — K219 Gastro-esophageal reflux disease without esophagitis: Secondary | ICD-10-CM | POA: Diagnosis not present

## 2021-09-13 DIAGNOSIS — I10 Essential (primary) hypertension: Secondary | ICD-10-CM | POA: Diagnosis not present

## 2021-09-13 DIAGNOSIS — Z0000 Encounter for general adult medical examination without abnormal findings: Secondary | ICD-10-CM | POA: Diagnosis not present

## 2021-09-13 DIAGNOSIS — Z6823 Body mass index (BMI) 23.0-23.9, adult: Secondary | ICD-10-CM | POA: Diagnosis not present

## 2021-09-13 DIAGNOSIS — F330 Major depressive disorder, recurrent, mild: Secondary | ICD-10-CM | POA: Diagnosis not present

## 2021-09-18 DIAGNOSIS — Z1283 Encounter for screening for malignant neoplasm of skin: Secondary | ICD-10-CM | POA: Diagnosis not present

## 2021-09-18 DIAGNOSIS — D239 Other benign neoplasm of skin, unspecified: Secondary | ICD-10-CM | POA: Diagnosis not present

## 2021-09-18 DIAGNOSIS — Z85820 Personal history of malignant melanoma of skin: Secondary | ICD-10-CM | POA: Diagnosis not present

## 2021-09-20 DIAGNOSIS — H0011 Chalazion right upper eyelid: Secondary | ICD-10-CM | POA: Diagnosis not present

## 2021-10-03 ENCOUNTER — Encounter: Payer: Self-pay | Admitting: *Deleted

## 2021-10-18 DIAGNOSIS — I781 Nevus, non-neoplastic: Secondary | ICD-10-CM | POA: Diagnosis not present

## 2021-10-18 DIAGNOSIS — I87393 Chronic venous hypertension (idiopathic) with other complications of bilateral lower extremity: Secondary | ICD-10-CM | POA: Diagnosis not present

## 2021-10-18 DIAGNOSIS — H0011 Chalazion right upper eyelid: Secondary | ICD-10-CM | POA: Diagnosis not present

## 2021-10-18 DIAGNOSIS — R252 Cramp and spasm: Secondary | ICD-10-CM | POA: Diagnosis not present

## 2022-01-16 DIAGNOSIS — K219 Gastro-esophageal reflux disease without esophagitis: Secondary | ICD-10-CM | POA: Diagnosis not present

## 2022-01-16 DIAGNOSIS — I10 Essential (primary) hypertension: Secondary | ICD-10-CM | POA: Diagnosis not present

## 2022-01-16 DIAGNOSIS — Z6825 Body mass index (BMI) 25.0-25.9, adult: Secondary | ICD-10-CM | POA: Diagnosis not present

## 2022-01-16 DIAGNOSIS — F330 Major depressive disorder, recurrent, mild: Secondary | ICD-10-CM | POA: Diagnosis not present

## 2022-01-16 DIAGNOSIS — Z0000 Encounter for general adult medical examination without abnormal findings: Secondary | ICD-10-CM | POA: Diagnosis not present

## 2022-01-16 DIAGNOSIS — M810 Age-related osteoporosis without current pathological fracture: Secondary | ICD-10-CM | POA: Diagnosis not present

## 2022-03-28 DIAGNOSIS — L570 Actinic keratosis: Secondary | ICD-10-CM | POA: Diagnosis not present

## 2022-03-28 DIAGNOSIS — H00019 Hordeolum externum unspecified eye, unspecified eyelid: Secondary | ICD-10-CM | POA: Diagnosis not present

## 2022-03-28 DIAGNOSIS — D239 Other benign neoplasm of skin, unspecified: Secondary | ICD-10-CM | POA: Diagnosis not present

## 2022-03-28 DIAGNOSIS — Z1283 Encounter for screening for malignant neoplasm of skin: Secondary | ICD-10-CM | POA: Diagnosis not present

## 2022-03-28 DIAGNOSIS — Z85820 Personal history of malignant melanoma of skin: Secondary | ICD-10-CM | POA: Diagnosis not present

## 2022-04-02 ENCOUNTER — Encounter: Payer: Self-pay | Admitting: *Deleted

## 2022-05-19 IMAGING — MR MR LUMBAR SPINE W/O CM
4 of 5 series · 25 of 48 positions shown · non-contrast
Comparison: 09/17/2013

CLINICAL DATA: Right-sided low back pain with right leg cramps.

EXAM:
MRI LUMBAR SPINE WITHOUT CONTRAST
TECHNIQUE: Multiplanar, multisequence MR imaging of the lumbar spine was
performed. No intravenous contrast was administered.

[Series 3: T2 · sagittal · 4.0mm · 0.53mm/px · 6 of 16 slices shown (1 of 2)]
[im 1/16]
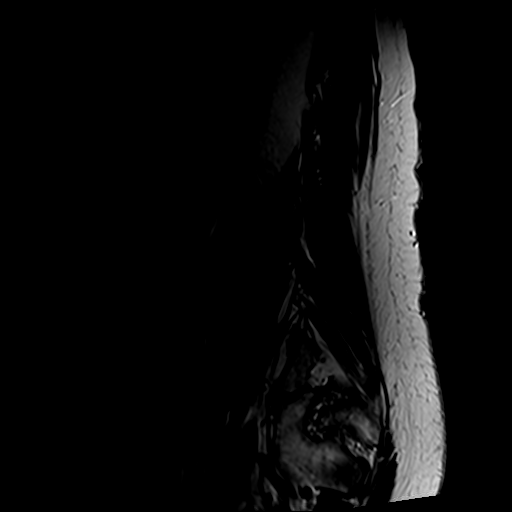
[im 4/16]
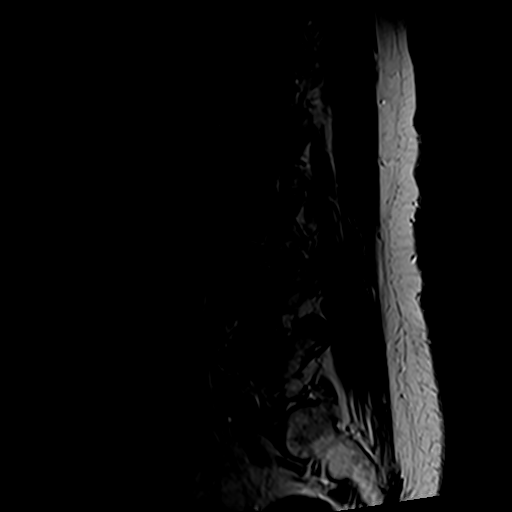
[im 7/16]
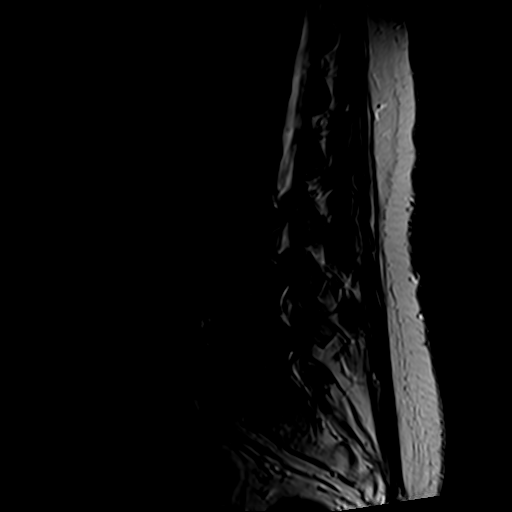
[im 10/16]
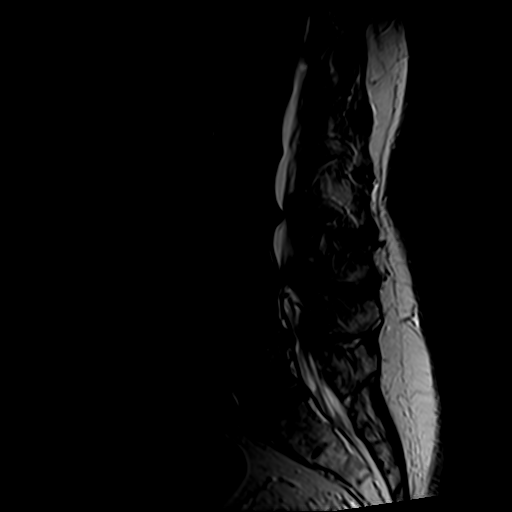
[im 13/16]
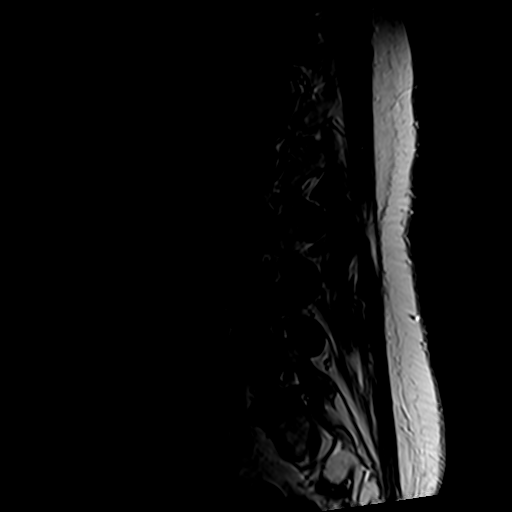
[im 16/16]
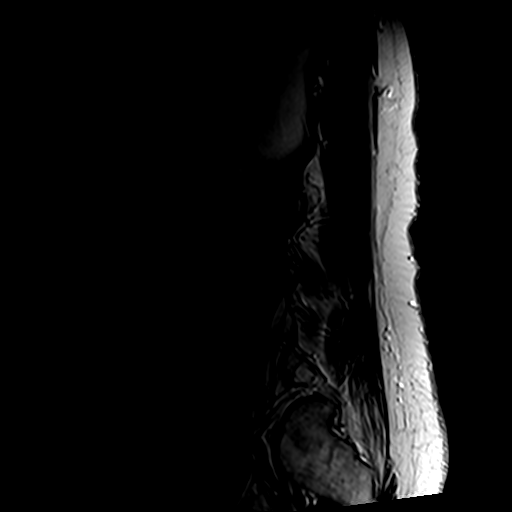

[Series 5: T1 · sagittal · 4.0mm · 0.53mm/px · 6 of 16 slices shown (1 of 2)]
[im 1/16]
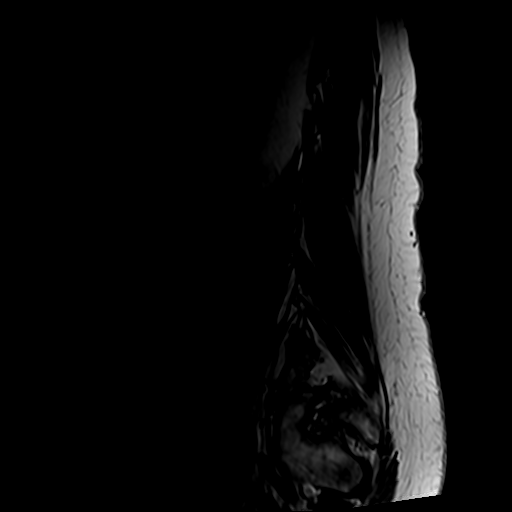
[im 4/16]
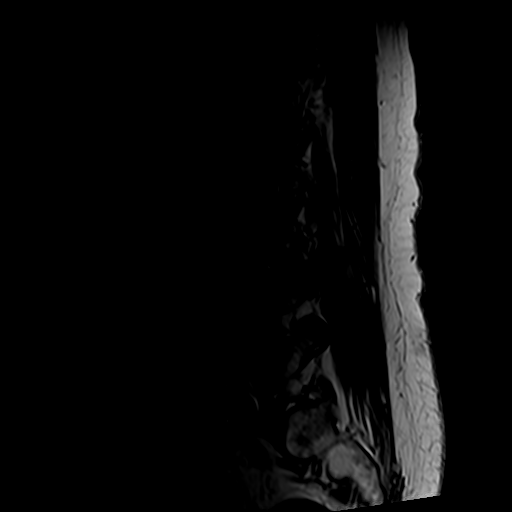
[im 7/16]
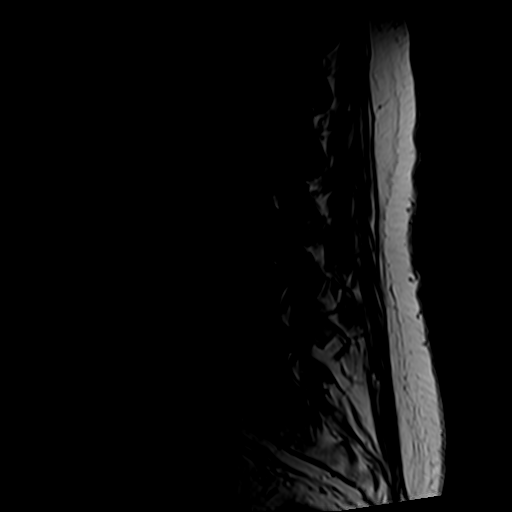
[im 10/16]
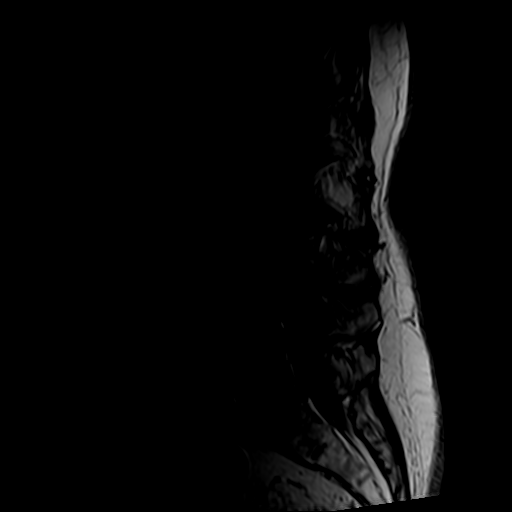
[im 13/16]
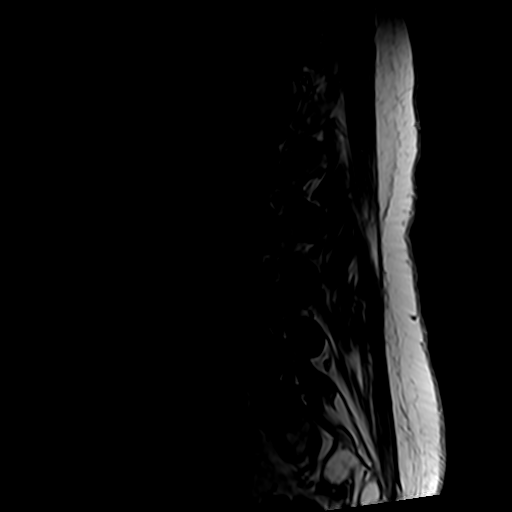
[im 16/16]
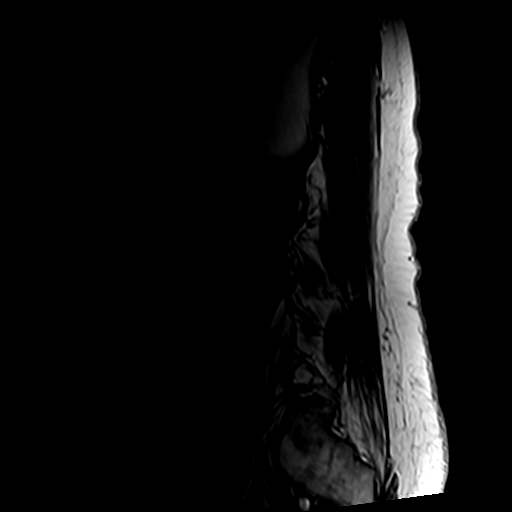

[Series 6: T2 · axial · 4.0mm · 0.70mm/px · z∈[-42,+170]mm · 9 of 38 slices shown (2 of 2)]
[im 1/38]
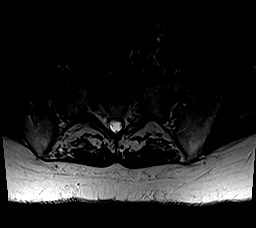
[im 6/38]
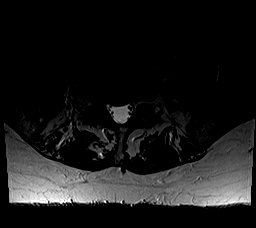
[im 11/38]
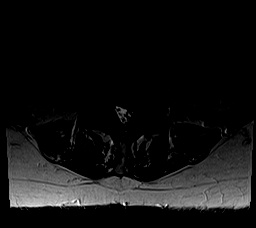
[im 16/38]
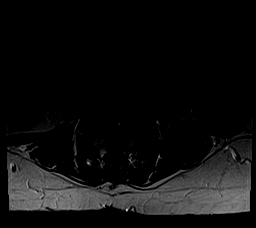
[im 19/38]
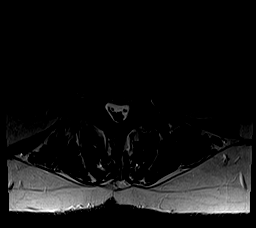
[im 22/38]
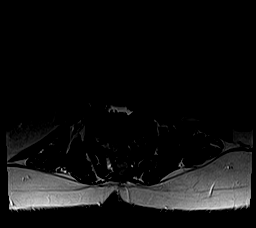
[im 27/38]
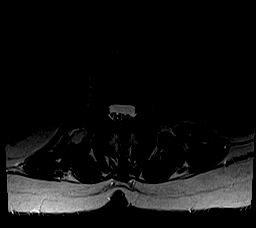
[im 32/38]
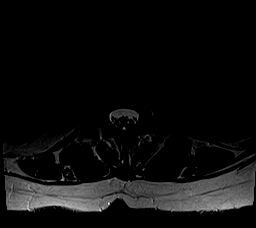
[im 38/38]
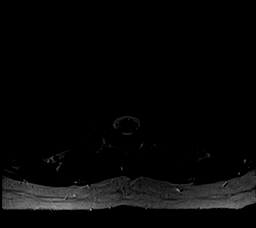

[Series 7: T1 · axial · 4.0mm · 0.35mm/px · z∈[-42,+139]mm · 4 of 38 slices shown (2 of 2)]
[im 1/38]
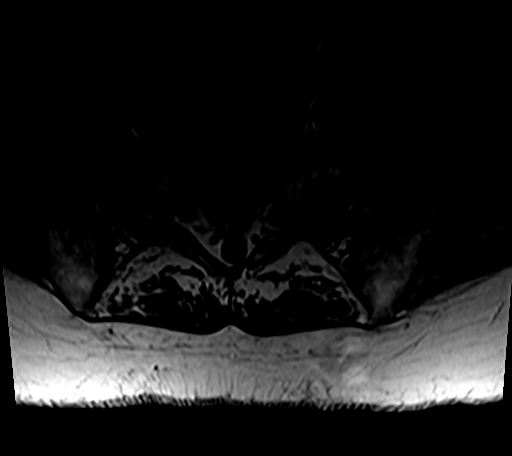
[im 6/38]
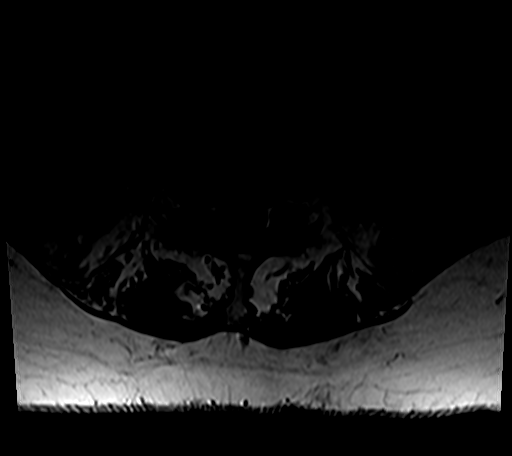
[im 19/38]
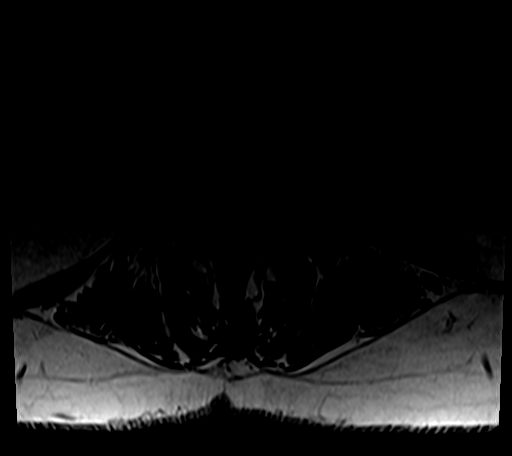
[im 32/38]
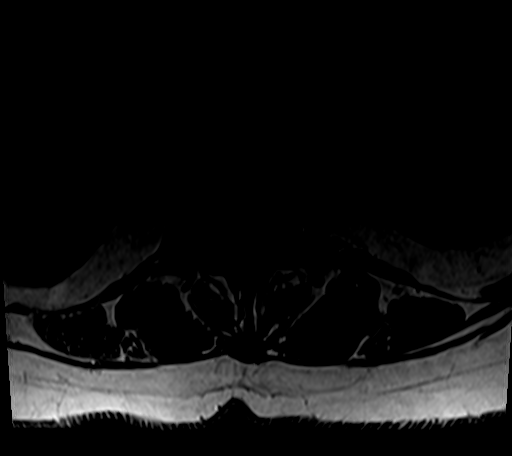

[25 of 48 positions shown; findings below may reference images not displayed]

FINDINGS: Segmentation: Mildly transitional lumbosacral anatomy with partial
sacralization of L5 on the left. Open L5-S1 disc space.

Alignment:  Slight lower lumbar levoscoliosis.  No listhesis.

Vertebrae: No fracture, suspicious osseous lesion, or significant
marrow edema. Chronic degenerative endplate changes at L4-5 greater
than L2-3.

Conus medullaris and cauda equina: Conus extends to the L1-2 level
with normal signal. There is redundancy of the cauda equina nerve
roots in the lower lumbar spine related to high-grade spinal
stenosis.

Paraspinal and other soft tissues: Unremarkable.

Disc levels:

Disc desiccation throughout the lumbar spine with exception of
L5-S1. Unchanged mild to moderate disc space narrowing at L2-3 and
severe narrowing at L4-5. Increased, mild-to-moderate disc space
narrowing at L3-4.

T12-L1: Negative.

L1-2: New minimal disc bulging and mild facet hypertrophy without
stenosis.

L2-3: Circumferential disc bulging and moderate facet and ligamentum
flavum hypertrophy result in mild spinal stenosis and
mild-to-moderate bilateral lateral recess and neural foraminal
stenosis, not significantly changed.

L3-4: Circumferential disc bulging eccentric to the left, a new
moderate-sized left paracentral disc extrusion, and moderate facet
and ligamentum flavum hypertrophy result in progressive, severe
spinal stenosis, moderate right and severe left lateral recess
stenosis, and moderate left neural foraminal stenosis. There is
potential impingement of multiple nerve roots at this level,
particularly the left L4 nerve root.

L4-5: Circumferential disc bulging, a broad left subarticular disc
protrusion, and mild facet and ligamentum flavum hypertrophy result
in mild spinal stenosis, severe left greater than right lateral
recess stenosis, and mild left greater than right neural foraminal
stenosis, not significantly changed. Potential bilateral L5 nerve
root impingement, particularly on the left.

L5-S1: A small right paracentral disc protrusion mildly narrows the
right lateral recess and contacts the right S1 nerve root,
unchanged. Rightward disc bulging and endplate spurring result in
unchanged mild right neural foraminal stenosis. No spinal stenosis.
IMPRESSION: 1. New L3-4 disc extrusion with severe spinal and left lateral
recess stenosis.
2. Unchanged severe left greater than right lateral recess stenosis
at L4-5.
3. Unchanged mild spinal stenosis at L2-3.

## 2022-05-21 DIAGNOSIS — F330 Major depressive disorder, recurrent, mild: Secondary | ICD-10-CM | POA: Diagnosis not present

## 2022-05-21 DIAGNOSIS — Z0001 Encounter for general adult medical examination with abnormal findings: Secondary | ICD-10-CM | POA: Diagnosis not present

## 2022-05-21 DIAGNOSIS — M810 Age-related osteoporosis without current pathological fracture: Secondary | ICD-10-CM | POA: Diagnosis not present

## 2022-05-21 DIAGNOSIS — I10 Essential (primary) hypertension: Secondary | ICD-10-CM | POA: Diagnosis not present

## 2022-05-21 DIAGNOSIS — K219 Gastro-esophageal reflux disease without esophagitis: Secondary | ICD-10-CM | POA: Diagnosis not present

## 2022-05-21 DIAGNOSIS — Z0000 Encounter for general adult medical examination without abnormal findings: Secondary | ICD-10-CM | POA: Diagnosis not present

## 2022-05-21 DIAGNOSIS — Z6825 Body mass index (BMI) 25.0-25.9, adult: Secondary | ICD-10-CM | POA: Diagnosis not present

## 2022-05-24 DIAGNOSIS — Z1231 Encounter for screening mammogram for malignant neoplasm of breast: Secondary | ICD-10-CM | POA: Diagnosis not present

## 2022-05-30 ENCOUNTER — Encounter (INDEPENDENT_AMBULATORY_CARE_PROVIDER_SITE_OTHER): Payer: Self-pay | Admitting: *Deleted

## 2022-09-17 DIAGNOSIS — M810 Age-related osteoporosis without current pathological fracture: Secondary | ICD-10-CM | POA: Diagnosis not present

## 2022-09-17 DIAGNOSIS — Z6825 Body mass index (BMI) 25.0-25.9, adult: Secondary | ICD-10-CM | POA: Diagnosis not present

## 2022-09-17 DIAGNOSIS — I10 Essential (primary) hypertension: Secondary | ICD-10-CM | POA: Diagnosis not present

## 2022-09-17 DIAGNOSIS — Z0000 Encounter for general adult medical examination without abnormal findings: Secondary | ICD-10-CM | POA: Diagnosis not present

## 2022-09-17 DIAGNOSIS — F330 Major depressive disorder, recurrent, mild: Secondary | ICD-10-CM | POA: Diagnosis not present

## 2022-09-17 DIAGNOSIS — K219 Gastro-esophageal reflux disease without esophagitis: Secondary | ICD-10-CM | POA: Diagnosis not present

## 2022-09-19 DIAGNOSIS — L309 Dermatitis, unspecified: Secondary | ICD-10-CM | POA: Diagnosis not present

## 2022-09-19 DIAGNOSIS — Z85828 Personal history of other malignant neoplasm of skin: Secondary | ICD-10-CM | POA: Diagnosis not present

## 2022-09-19 DIAGNOSIS — D485 Neoplasm of uncertain behavior of skin: Secondary | ICD-10-CM | POA: Diagnosis not present

## 2022-09-19 DIAGNOSIS — Z1283 Encounter for screening for malignant neoplasm of skin: Secondary | ICD-10-CM | POA: Diagnosis not present

## 2022-10-29 ENCOUNTER — Encounter: Payer: Self-pay | Admitting: *Deleted

## 2023-01-15 DIAGNOSIS — K219 Gastro-esophageal reflux disease without esophagitis: Secondary | ICD-10-CM | POA: Diagnosis not present

## 2023-01-15 DIAGNOSIS — M810 Age-related osteoporosis without current pathological fracture: Secondary | ICD-10-CM | POA: Diagnosis not present

## 2023-01-15 DIAGNOSIS — I10 Essential (primary) hypertension: Secondary | ICD-10-CM | POA: Diagnosis not present

## 2023-01-15 DIAGNOSIS — M5418 Radiculopathy, sacral and sacrococcygeal region: Secondary | ICD-10-CM | POA: Diagnosis not present

## 2023-01-15 DIAGNOSIS — F330 Major depressive disorder, recurrent, mild: Secondary | ICD-10-CM | POA: Diagnosis not present

## 2023-01-15 DIAGNOSIS — Z6826 Body mass index (BMI) 26.0-26.9, adult: Secondary | ICD-10-CM | POA: Diagnosis not present

## 2023-01-15 DIAGNOSIS — N182 Chronic kidney disease, stage 2 (mild): Secondary | ICD-10-CM | POA: Diagnosis not present

## 2023-01-29 DIAGNOSIS — I700 Atherosclerosis of aorta: Secondary | ICD-10-CM | POA: Diagnosis not present

## 2023-01-29 DIAGNOSIS — M545 Low back pain, unspecified: Secondary | ICD-10-CM | POA: Diagnosis not present

## 2023-01-29 DIAGNOSIS — M48061 Spinal stenosis, lumbar region without neurogenic claudication: Secondary | ICD-10-CM | POA: Diagnosis not present

## 2023-01-29 DIAGNOSIS — M5136 Other intervertebral disc degeneration, lumbar region with discogenic back pain only: Secondary | ICD-10-CM | POA: Diagnosis not present

## 2023-02-18 DIAGNOSIS — M48061 Spinal stenosis, lumbar region without neurogenic claudication: Secondary | ICD-10-CM | POA: Diagnosis not present

## 2023-02-18 DIAGNOSIS — M47816 Spondylosis without myelopathy or radiculopathy, lumbar region: Secondary | ICD-10-CM | POA: Diagnosis not present

## 2023-02-18 DIAGNOSIS — M47817 Spondylosis without myelopathy or radiculopathy, lumbosacral region: Secondary | ICD-10-CM | POA: Diagnosis not present

## 2023-02-18 DIAGNOSIS — M545 Low back pain, unspecified: Secondary | ICD-10-CM | POA: Diagnosis not present

## 2023-02-18 DIAGNOSIS — M4316 Spondylolisthesis, lumbar region: Secondary | ICD-10-CM | POA: Diagnosis not present

## 2023-02-25 ENCOUNTER — Other Ambulatory Visit: Payer: Self-pay | Admitting: Internal Medicine

## 2023-02-25 DIAGNOSIS — M545 Low back pain, unspecified: Secondary | ICD-10-CM

## 2023-03-01 NOTE — Discharge Instructions (Signed)

## 2023-03-04 ENCOUNTER — Inpatient Hospital Stay
Admission: RE | Admit: 2023-03-04 | Discharge: 2023-03-04 | Disposition: A | Discharge status: Home / Self Care | Payer: Medicare PPO | Source: Ambulatory Visit | Attending: Internal Medicine | Admitting: Internal Medicine

## 2023-03-04 DIAGNOSIS — M48061 Spinal stenosis, lumbar region without neurogenic claudication: Secondary | ICD-10-CM | POA: Diagnosis not present

## 2023-03-04 DIAGNOSIS — M545 Low back pain, unspecified: Secondary | ICD-10-CM

## 2023-03-04 MED ORDER — METHYLPREDNISOLONE ACETATE 40 MG/ML INJ SUSP (RADIOLOG
80.0000 mg | Freq: Once | INTRAMUSCULAR | Status: AC
  Administered 2023-03-04: 80 mg via EPIDURAL

## 2023-03-04 MED ORDER — IOPAMIDOL (ISOVUE-M 200) INJECTION 41%
1.0000 mL | Freq: Once | INTRAMUSCULAR | Status: AC
  Administered 2023-03-04: 1 mL via EPIDURAL

## 2023-03-19 DIAGNOSIS — M545 Low back pain, unspecified: Secondary | ICD-10-CM | POA: Diagnosis not present

## 2023-03-19 DIAGNOSIS — R29898 Other symptoms and signs involving the musculoskeletal system: Secondary | ICD-10-CM | POA: Diagnosis not present

## 2023-03-26 DIAGNOSIS — L820 Inflamed seborrheic keratosis: Secondary | ICD-10-CM | POA: Diagnosis not present

## 2023-03-26 DIAGNOSIS — M545 Low back pain, unspecified: Secondary | ICD-10-CM | POA: Diagnosis not present

## 2023-03-26 DIAGNOSIS — Z85820 Personal history of malignant melanoma of skin: Secondary | ICD-10-CM | POA: Diagnosis not present

## 2023-03-26 DIAGNOSIS — R29898 Other symptoms and signs involving the musculoskeletal system: Secondary | ICD-10-CM | POA: Diagnosis not present

## 2023-03-26 DIAGNOSIS — L570 Actinic keratosis: Secondary | ICD-10-CM | POA: Diagnosis not present

## 2023-04-01 DIAGNOSIS — M545 Low back pain, unspecified: Secondary | ICD-10-CM | POA: Diagnosis not present

## 2023-04-01 DIAGNOSIS — R29898 Other symptoms and signs involving the musculoskeletal system: Secondary | ICD-10-CM | POA: Diagnosis not present

## 2023-04-09 DIAGNOSIS — M545 Low back pain, unspecified: Secondary | ICD-10-CM | POA: Diagnosis not present

## 2023-04-09 DIAGNOSIS — R29898 Other symptoms and signs involving the musculoskeletal system: Secondary | ICD-10-CM | POA: Diagnosis not present

## 2023-04-12 DIAGNOSIS — M545 Low back pain, unspecified: Secondary | ICD-10-CM | POA: Diagnosis not present

## 2023-04-12 DIAGNOSIS — R29898 Other symptoms and signs involving the musculoskeletal system: Secondary | ICD-10-CM | POA: Diagnosis not present

## 2023-04-17 DIAGNOSIS — M545 Low back pain, unspecified: Secondary | ICD-10-CM | POA: Diagnosis not present

## 2023-04-17 DIAGNOSIS — R29898 Other symptoms and signs involving the musculoskeletal system: Secondary | ICD-10-CM | POA: Diagnosis not present

## 2023-05-03 DIAGNOSIS — M545 Low back pain, unspecified: Secondary | ICD-10-CM | POA: Diagnosis not present

## 2023-05-03 DIAGNOSIS — R29898 Other symptoms and signs involving the musculoskeletal system: Secondary | ICD-10-CM | POA: Diagnosis not present

## 2023-05-07 DIAGNOSIS — R29898 Other symptoms and signs involving the musculoskeletal system: Secondary | ICD-10-CM | POA: Diagnosis not present

## 2023-05-07 DIAGNOSIS — M545 Low back pain, unspecified: Secondary | ICD-10-CM | POA: Diagnosis not present

## 2023-05-09 DIAGNOSIS — R29898 Other symptoms and signs involving the musculoskeletal system: Secondary | ICD-10-CM | POA: Diagnosis not present

## 2023-05-09 DIAGNOSIS — M545 Low back pain, unspecified: Secondary | ICD-10-CM | POA: Diagnosis not present

## 2023-05-14 DIAGNOSIS — M545 Low back pain, unspecified: Secondary | ICD-10-CM | POA: Diagnosis not present

## 2023-05-14 DIAGNOSIS — R29898 Other symptoms and signs involving the musculoskeletal system: Secondary | ICD-10-CM | POA: Diagnosis not present

## 2023-06-04 DIAGNOSIS — I10 Essential (primary) hypertension: Secondary | ICD-10-CM | POA: Diagnosis not present

## 2023-06-04 DIAGNOSIS — F330 Major depressive disorder, recurrent, mild: Secondary | ICD-10-CM | POA: Diagnosis not present

## 2023-06-04 DIAGNOSIS — M5418 Radiculopathy, sacral and sacrococcygeal region: Secondary | ICD-10-CM | POA: Diagnosis not present

## 2023-06-04 DIAGNOSIS — Z6825 Body mass index (BMI) 25.0-25.9, adult: Secondary | ICD-10-CM | POA: Diagnosis not present

## 2023-06-04 DIAGNOSIS — M810 Age-related osteoporosis without current pathological fracture: Secondary | ICD-10-CM | POA: Diagnosis not present

## 2023-06-04 DIAGNOSIS — Z0000 Encounter for general adult medical examination without abnormal findings: Secondary | ICD-10-CM | POA: Diagnosis not present

## 2023-06-04 DIAGNOSIS — N182 Chronic kidney disease, stage 2 (mild): Secondary | ICD-10-CM | POA: Diagnosis not present

## 2023-06-04 DIAGNOSIS — K219 Gastro-esophageal reflux disease without esophagitis: Secondary | ICD-10-CM | POA: Diagnosis not present

## 2023-06-05 ENCOUNTER — Encounter (INDEPENDENT_AMBULATORY_CARE_PROVIDER_SITE_OTHER): Payer: Self-pay | Admitting: *Deleted

## 2023-07-10 DIAGNOSIS — Z13820 Encounter for screening for osteoporosis: Secondary | ICD-10-CM | POA: Diagnosis not present

## 2023-07-10 DIAGNOSIS — M810 Age-related osteoporosis without current pathological fracture: Secondary | ICD-10-CM | POA: Diagnosis not present

## 2023-07-10 DIAGNOSIS — M8588 Other specified disorders of bone density and structure, other site: Secondary | ICD-10-CM | POA: Diagnosis not present

## 2023-07-10 DIAGNOSIS — Z780 Asymptomatic menopausal state: Secondary | ICD-10-CM | POA: Diagnosis not present

## 2023-07-29 DIAGNOSIS — R252 Cramp and spasm: Secondary | ICD-10-CM | POA: Diagnosis not present

## 2023-07-29 DIAGNOSIS — I872 Venous insufficiency (chronic) (peripheral): Secondary | ICD-10-CM | POA: Diagnosis not present

## 2023-07-29 DIAGNOSIS — I83893 Varicose veins of bilateral lower extremities with other complications: Secondary | ICD-10-CM | POA: Diagnosis not present

## 2023-08-26 DIAGNOSIS — M25552 Pain in left hip: Secondary | ICD-10-CM | POA: Diagnosis not present

## 2023-09-11 DIAGNOSIS — M4807 Spinal stenosis, lumbosacral region: Secondary | ICD-10-CM | POA: Diagnosis not present

## 2023-09-11 DIAGNOSIS — M5416 Radiculopathy, lumbar region: Secondary | ICD-10-CM | POA: Diagnosis not present

## 2023-09-11 DIAGNOSIS — M47816 Spondylosis without myelopathy or radiculopathy, lumbar region: Secondary | ICD-10-CM | POA: Diagnosis not present

## 2023-10-10 DIAGNOSIS — M810 Age-related osteoporosis without current pathological fracture: Secondary | ICD-10-CM | POA: Diagnosis not present

## 2023-10-10 DIAGNOSIS — Z0000 Encounter for general adult medical examination without abnormal findings: Secondary | ICD-10-CM | POA: Diagnosis not present

## 2023-10-10 DIAGNOSIS — F330 Major depressive disorder, recurrent, mild: Secondary | ICD-10-CM | POA: Diagnosis not present

## 2023-10-10 DIAGNOSIS — M5418 Radiculopathy, sacral and sacrococcygeal region: Secondary | ICD-10-CM | POA: Diagnosis not present

## 2023-10-10 DIAGNOSIS — I10 Essential (primary) hypertension: Secondary | ICD-10-CM | POA: Diagnosis not present

## 2023-10-10 DIAGNOSIS — K219 Gastro-esophageal reflux disease without esophagitis: Secondary | ICD-10-CM | POA: Diagnosis not present

## 2023-10-10 DIAGNOSIS — Z6825 Body mass index (BMI) 25.0-25.9, adult: Secondary | ICD-10-CM | POA: Diagnosis not present

## 2023-10-10 DIAGNOSIS — N182 Chronic kidney disease, stage 2 (mild): Secondary | ICD-10-CM | POA: Diagnosis not present

## 2023-12-03 ENCOUNTER — Encounter (INDEPENDENT_AMBULATORY_CARE_PROVIDER_SITE_OTHER): Payer: Self-pay | Admitting: *Deleted

## 2024-01-06 DIAGNOSIS — M25562 Pain in left knee: Secondary | ICD-10-CM | POA: Diagnosis not present

## 2024-01-06 DIAGNOSIS — M1712 Unilateral primary osteoarthritis, left knee: Secondary | ICD-10-CM | POA: Diagnosis not present

## 2024-03-11 DIAGNOSIS — M810 Age-related osteoporosis without current pathological fracture: Secondary | ICD-10-CM | POA: Diagnosis not present

## 2024-03-11 DIAGNOSIS — I10 Essential (primary) hypertension: Secondary | ICD-10-CM | POA: Diagnosis not present

## 2024-03-11 DIAGNOSIS — K219 Gastro-esophageal reflux disease without esophagitis: Secondary | ICD-10-CM | POA: Diagnosis not present

## 2024-03-11 DIAGNOSIS — N182 Chronic kidney disease, stage 2 (mild): Secondary | ICD-10-CM | POA: Diagnosis not present

## 2024-03-11 DIAGNOSIS — Z6824 Body mass index (BMI) 24.0-24.9, adult: Secondary | ICD-10-CM | POA: Diagnosis not present

## 2024-03-11 DIAGNOSIS — F330 Major depressive disorder, recurrent, mild: Secondary | ICD-10-CM | POA: Diagnosis not present

## 2024-03-11 DIAGNOSIS — M5418 Radiculopathy, sacral and sacrococcygeal region: Secondary | ICD-10-CM | POA: Diagnosis not present
